# Patient Record
Sex: Female | Born: 1945
Health system: Southern US, Community
[De-identification: ages and names within clinical notes are randomized; demographics above are authoritative.]

## PROBLEM LIST (undated history)

## (undated) DIAGNOSIS — M1990 Unspecified osteoarthritis, unspecified site: Secondary | ICD-10-CM

## (undated) DIAGNOSIS — E785 Hyperlipidemia, unspecified: Secondary | ICD-10-CM

## (undated) DIAGNOSIS — J189 Pneumonia, unspecified organism: Secondary | ICD-10-CM

## (undated) DIAGNOSIS — M8580 Other specified disorders of bone density and structure, unspecified site: Secondary | ICD-10-CM

## (undated) DIAGNOSIS — M810 Age-related osteoporosis without current pathological fracture: Secondary | ICD-10-CM

## (undated) DIAGNOSIS — H3530 Unspecified macular degeneration: Secondary | ICD-10-CM

## (undated) DIAGNOSIS — I8390 Asymptomatic varicose veins of unspecified lower extremity: Secondary | ICD-10-CM

## (undated) HISTORY — DX: Age-related osteoporosis without current pathological fracture: M81.0

## (undated) HISTORY — DX: Other specified disorders of bone density and structure, unspecified site: M85.80

## (undated) HISTORY — DX: Hyperlipidemia, unspecified: E78.5

## (undated) HISTORY — DX: Unspecified macular degeneration: H35.30

## (undated) HISTORY — DX: Asymptomatic varicose veins of unspecified lower extremity: I83.90

## (undated) HISTORY — PX: PARTIAL HYSTERECTOMY: SHX80

## (undated) HISTORY — PX: CATARACT EXTRACTION: SUR2

---

## 2000-09-24 ENCOUNTER — Ambulatory Visit (HOSPITAL_BASED_OUTPATIENT_CLINIC_OR_DEPARTMENT_OTHER): Admission: RE | Admit: 2000-09-24 | Discharge: 2000-09-24 | Payer: Self-pay | Admitting: *Deleted

## 2002-04-13 HISTORY — PX: WRIST FRACTURE SURGERY: SHX121

## 2003-01-05 ENCOUNTER — Ambulatory Visit (HOSPITAL_COMMUNITY): Admission: RE | Admit: 2003-01-05 | Discharge: 2003-01-05 | Payer: Self-pay | Admitting: Gastroenterology

## 2005-04-13 HISTORY — PX: FRACTURE SURGERY: SHX138

## 2005-09-21 ENCOUNTER — Ambulatory Visit (HOSPITAL_BASED_OUTPATIENT_CLINIC_OR_DEPARTMENT_OTHER): Admission: RE | Admit: 2005-09-21 | Discharge: 2005-09-22 | Payer: Self-pay | Admitting: *Deleted

## 2007-09-10 ENCOUNTER — Emergency Department (HOSPITAL_COMMUNITY): Admission: EM | Admit: 2007-09-10 | Discharge: 2007-09-10 | Payer: Self-pay | Admitting: Emergency Medicine

## 2010-08-29 NOTE — Op Note (Signed)
   NAME:  Angela Collier, Angela Collier                        ACCOUNT NO.:  [id number]   MEDICAL RECORD NO.:  [id number]                   PATIENT TYPE:  AMB   LOCATION:  ENDO                                 FACILITY:  [facility]   PHYSICIAN:  John C. Madilyn Fireman, M.D.                 DATE OF BIRTH:  1946/02/19   DATE OF PROCEDURE:  01/05/2003  DATE OF DISCHARGE:                                 OPERATIVE REPORT   PROCEDURE:  Colonoscopy.   INDICATIONS FOR PROCEDURE:  Family history of colon in a first-degree  relative.   DESCRIPTION OF PROCEDURE:  The patient was placed in the left lateral  decubitus position and placed on the pulse monitor with continuous low-flow  oxygen delivered by nasal cannula.  She was sedated with 62.5 mcg IV  fentanyl, 6 mg IV Versed.  The Olympus video colonoscope was inserted into  the rectum and advanced to the cecum, confirmed by transillumination of  McBurney's point and visualization of the ileocecal valve and appendiceal  orifice.  Prep was excellent.  The cecum, ascending, transverse, descending,  and sigmoid colon all appeared normal with no masses, polyps, diverticula,  or other mucosal abnormalities.  The rectum likewise appeared normal and  retroflexed view of the anus revealed no obviously internal hemorrhoids.  The colonoscope was then withdrawn and the patient returned to the recovery  room in stable condition.  She tolerated the procedure well and there were  no immediate complications.   IMPRESSION:  Normal colonoscopy.   PLAN:  Repeat colonoscopy in five years based on her family history.                                                John C. Madilyn Fireman, M.D.    JCH/MEDQ  D:  01/05/2003  T:  01/05/2003  Job:  [id number]   cc:   Sharlet Salina, M.D.  [address]  [place]  [place] [address]  Fax: [phone]

## 2010-08-29 NOTE — Op Note (Signed)
[place]  Patient:    Angela Collier, Angela Collier                     MRN: [id number] Proc. Date: 09/24/00 Adm. Date:  [id number] Attending:  Kendell Bane CC:         Austin Miles. Asencion Islam, M.D.   Operative Report  PREOPERATIVE DIAGNOSIS:  Comminuted intra-articular fracture left distal radius with ulnar styloid fracture.  POSTOPERATIVE DIAGNOSIS:  Comminuted intra-articular fracture left distal radius with ulnar styloid fracture.  OPERATION:  Open reduction and internal fixation left distal radius fracture.  SURGEON:  Lowell Bouton, M.D.  ANESTHESIA:  General.  OPERATIVE FINDINGS:  The patient had a comminuted fracture that had both dorsal and volar comminution.  The intra-articular component of the fracture did not appear to be displaced.  The ulnar styloid fracture did not appear to be displaced.  DESCRIPTION OF PROCEDURE:  Under general anesthesia with the tourniquet on the left arm, the left hand was prepped and draped in the usual fashion.  After exsanguinating the limb, the tourniquet was inflated to 225 mmHg.  The arm was laid on the arm table in a supinated position, and finger traps were placed on the index and middle fingers.  Ten pounds of traction were then applied across the wrist, hanging over the side of the table.  A longitudinal incision was made over the FCR tendons and then extended in a zigzag across the wrist. Sharp dissection was carried through the subcutaneous tissues, and bleeding points were coagulated.  Blunt dissection was carried down to the FCR tendon, and the sheath was incised longitudinally.  The flexor carpi radialis was retracted ulnarly, and the interval between the radial artery and the FCR waws exposed.  Blunt dissection was carried down to the pronator quadratus which was sharply dissected off of the radius radially, leaving a cuff of tissue to reattach it.  A Freer elevator was used to  elevate the pronator quadratus ulnarly.  Weitlaner retractors were inserted for exposure, and the fracture site was identified.  It was irrigated with saline and reduced.  There was a good ulnar fragment that was reduced anatomically.  The more radial fragments were severely comminuted and were osteopenic.  After reducing the fracture, x-rays were obtained with the traction in placed and showed good alignment. The left-sided DVR plate was then applied, and the 3.5 screw was inserted in the sliding slot hole of the plate.  The plate was adjusted for length and rotation.  X-rays were then obtained again showing good position of the plate without involving the joint.  The 2.5 non-threaded pegs were then inserted distally in the plate.  The remaining 3.5 screws x 3 were inserted proximally. This allowed for a buttress effect, and the traction was removed.  X-rays showed good alignment with no motion at the fracture site under C-arm evaluation.  The wound was then copiously irrigated, and the pronator quadratus was reattached with 4-0 Vicryl suture.  A vessel loop drain was left in for drainage.  Subcutaneous tissue was closed with 4-0 Vicryl, and the skin was closed with a 3-0 subcuticular Prolene.  Steri-Strips were applied followed by sterile dressings and a volar wrist splint.  The patient tolerated he procedure well and went to the recovery room awake and stable in good condition. DD:  09/24/00 TD:  09/24/00 Job: [id number]

## 2010-08-29 NOTE — Op Note (Signed)
NAME:  Angela Collier, RAIMER NO.:  [id number]   MEDICAL RECORD NO.:  [id number]          PATIENT TYPE:  AMB   LOCATION:  DSC                          FACILITY:  [facility]   PHYSICIAN:  Tennis Must Meyerdierks, M.D.DATE OF BIRTH:  Feb 07, 1946   DATE OF PROCEDURE:  09/21/2005  DATE OF DISCHARGE:                                 OPERATIVE REPORT   PREOP DIAGNOSIS:  Comminuted intra-articular fracture right distal radius.   POSTOP DIAGNOSIS:  Comminuted intra-articular fracture right distal radius.   PROCEDURE:  Open reduction internal fixation right distal radius fracture  with freeze-dried cadaver bone graft.   SURGEON:  Lowell Bouton, M.D.   ANESTHESIA:  Interscalene block augmented with general.   OPERATIVE FINDINGS:  The patient had a severely comminuted intra-articular  fracture of the distal radius.  There was a large lunate dye-punch fragment  and significant comminution of the radial styloid.  There was also a volar  fragment.   DESCRIPTION OF PROCEDURE:  Under general anesthesia with a tourniquet on the  right arm.  The right hand was prepped and draped in the usual fashion; and  after exsanguinating the limb, the tourniquet was inflated to 250 mmHg.  Longitudinal traction was applied across the end of the table using 10  pounds of traction, and finger traps on the index and long fingers.  A  longitudinal incision was made overlying the FCR tendon volarly.  Sharp  dissection was carried through the subcutaneous tissues and bleeding points  were coagulated.  The FCR tendon sheath was opened with the scissors and  then the tendon was retracted ulnarly and the floor of the sheath was  incised with the scissors.  The median nerve was right there and was  protected.  The palmar cutaneous branch was protected.  The nerve was  retracted and the FPL muscle belly was retracted.   Blunt dissection was then carried down to the fracture and the pronator  quadratus was incised longitudinally and elevated away from the fracture.  The fracture was severely comminuted.  A Freer elevator was used to free up  the fracture fragments and due to the amount of comminution, the intra-  articular fragments were held with 405 K-wire x2.  This allowed a good  reduction of the articular surface.  A standard DVR plate was then applied  volarly; and after inserting the pegs, the K-wires were removed from the  joint fragments.  The 4 proximal screws were inserted along with 7 pegs  distally.  These were a combination of threaded and nonthreaded pegs.   X-rays showed good position of the fracture with good alignment and only  minimal step off at the articular surface.  Under fluoroscopy the fracture  was stable.  A bone graft had been inserted in the defect prior to applying  the plate using freeze-dried cadaver bone.  The bone quality was moderate.  After the fixation was completed, the wound was irrigated with saline and  the pronator quadratus was reattached with a 4-0 Vicryl and a PLS drain was  inserted.  Subcutaneous tissues were closed with 4-0 Vicryl and  the skin was closed  with a 3-0 subcuticular Prolene.  Steri-Strips were applied, followed by  sterile dressings, and a volar wrist splint.  The patient tolerated the  procedure well, and went to the recovery room awake and stable, in good  condition.      Lowell Bouton, M.D.  Electronically Signed     EMM/MEDQ  D:  09/21/2005  T:  09/21/2005  Job:  [id number]

## 2010-09-15 ENCOUNTER — Other Ambulatory Visit: Payer: Self-pay | Admitting: Family Medicine

## 2010-09-15 ENCOUNTER — Ambulatory Visit
Admission: RE | Admit: 2010-09-15 | Discharge: 2010-09-15 | Disposition: A | Discharge status: Home / Self Care | Payer: BC Managed Care – PPO | Source: Ambulatory Visit | Attending: Family Medicine | Admitting: Family Medicine

## 2010-09-15 DIAGNOSIS — M21949 Unspecified acquired deformity of hand, unspecified hand: Secondary | ICD-10-CM

## 2012-10-19 ENCOUNTER — Other Ambulatory Visit: Payer: Self-pay | Admitting: *Deleted

## 2012-10-19 DIAGNOSIS — I83893 Varicose veins of bilateral lower extremities with other complications: Secondary | ICD-10-CM

## 2012-10-27 ENCOUNTER — Encounter: Payer: Self-pay | Admitting: Vascular Surgery

## 2012-12-20 ENCOUNTER — Encounter: Payer: Self-pay | Admitting: Vascular Surgery

## 2012-12-21 ENCOUNTER — Encounter (INDEPENDENT_AMBULATORY_CARE_PROVIDER_SITE_OTHER): Payer: Medicare Other | Admitting: *Deleted

## 2012-12-21 ENCOUNTER — Ambulatory Visit (INDEPENDENT_AMBULATORY_CARE_PROVIDER_SITE_OTHER): Payer: Medicare Other | Admitting: Vascular Surgery

## 2012-12-21 ENCOUNTER — Encounter: Payer: Self-pay | Admitting: Vascular Surgery

## 2012-12-21 VITALS — Ht 63.0 in | Wt 131.0 lb

## 2012-12-21 DIAGNOSIS — I83893 Varicose veins of bilateral lower extremities with other complications: Secondary | ICD-10-CM

## 2012-12-21 NOTE — Progress Notes (Signed)
[facility]  Patient name: Angela Collier MRN: [id number] DOB: 12/04/1945 Sex: female  REASON FOR CONSULT: varicose veins.  HPI: Angela Collier is a 67 y.o. female with a long history of varicose veins which began when she began having children many years ago. He experiences aching pain and heaviness in both lower extremities. Her symptoms are more significant on the left side. She also notes occasional swelling in her lower extremities. She denies any previous history of DVT or phlebitis. 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 She denies any previous history of DVT or phlebitis.  I have reviewed her records from Dr. Alver Fisher office. She does have a history of osteopenia and takes calcium. She has hyperlipidemia which is followed closely.  Past Medical History  Diagnosis Date  . Macular degeneration   . Hyperlipidemia   . Osteopenia   . Varicose veins    Family History  Problem Relation Age of Onset  . Osteoarthritis Mother   . Cancer Father     liver  . Heart disease Father   . Hyperlipidemia Father   . Hypertension Father   . Heart attack Father   . Other Father     varicose veins  . Hyperlipidemia Sister    SOCIAL HISTORY: History  Substance Use Topics  . Smoking status: Never Smoker   . Smokeless tobacco: Never Used  . Alcohol Use: 1.8 oz/week    3 Glasses of wine per week   Allergies  Allergen Reactions  . Sulfa Antibiotics    Current Outpatient Prescriptions  Medication Sig Dispense Refill  . Multiple Vitamins-Minerals (MULTIVITAMIN WITH MINERALS) tablet Take 1 tablet by mouth daily.      Marland Kitchen Specialty Vitamins Products (ICAPS LUTEIN-ZEAXANTHIN PO) Take by mouth.       No current facility-administered medications for this visit.   REVIEW OF SYSTEMS: Arly.Keller ] denotes positive finding; [  ] denotes negative finding  CARDIOVASCULAR:  [ ]  chest pain   [ ]  chest pressure   [ ]  palpitations   [ ]  orthopnea   [ ]   dyspnea on exertion   [ ]  claudication   [ ]  rest pain   [ ]  DVT   [ ]  phlebitis PULMONARY:   [ ]  productive cough   [ ]  asthma   [ ]  wheezing NEUROLOGIC:   [ ]  weakness  [ ]  paresthesias  [ ]  aphasia  [ ]  amaurosis  [ ]  dizziness HEMATOLOGIC:   [ ]  bleeding problems   [ ]  clotting disorders MUSCULOSKELETAL:  [ ]  joint pain   [ ]  joint swelling [ ]  leg swelling GASTROINTESTINAL: [ ]   blood in stool  [ ]   hematemesis GENITOURINARY:  [ ]   dysuria  [ ]   hematuria PSYCHIATRIC:  [ ]  history of major depression INTEGUMENTARY:  [ ]  rashes  [ ]  ulcers CONSTITUTIONAL:  [ ]  fever   [ ]  chills  PHYSICAL EXAM: Filed Vitals:   12/21/12 1059  Height: 5\' 3"  (1.6 m)  Weight: 131 lb (59.421 kg)   Body mass index is 23.21 kg/(m^2). GENERAL: The patient is a well-nourished female, in no acute distress. The vital signs are documented above. CARDIOVASCULAR: There is a regular rate and rhythm. I do not detect carotid bruits. She has palpable femoral pulses and palpable pedal pulses bilaterally. PULMONARY: There is good air exchange bilaterally without wheezing or rales. ABDOMEN: Soft and non-tender with normal pitched bowel sounds.  MUSCULOSKELETAL: There are no  major deformities or cyanosis. NEUROLOGIC: No focal weakness or paresthesias are detected. SKIN: There are no ulcers or rashes noted. She has significant truncal varicosities of both lower extremities. PSYCHIATRIC: The patient has a normal affect.  DATA:  I have independently interpreted her venous duplex scan. On the right side, she does have incompetence of the common femoral vein. In addition she has incompetence of the saphenofemoral junction and proximal right greater saphenous vein. She has some incompetence of the common femoral vein on the left also and also of the popliteal vein. There is no incompetence of the left greater saphenous vein. She is noted to have a Baker's cyst in the left popliteal fossa.  MEDICAL ISSUES: PAINFUL VARICOSE  VEINS OF BOTH LOWER EXTREMITIES: We have discussed the importance of intermittent leg elevation and the proper positioning for this. In addition I have written her a prescription for a thigh high compression stockings with a gradient of 20-30 mm of mercury. I have encouraged her to stay as active as possible but to try to avoid prolonged sitting and standing. If her symptoms progress and she could be considered for laser ablation of her right greater saphenous vein. She will call if her symptoms progress.  Herson Prichard S [facility] [place] [facility]: [phone]

## 2013-02-16 ENCOUNTER — Other Ambulatory Visit: Payer: Self-pay

## 2013-03-21 ENCOUNTER — Ambulatory Visit: Payer: Medicare Other | Admitting: Vascular Surgery

## 2013-03-28 ENCOUNTER — Ambulatory Visit: Payer: Medicare Other | Admitting: Vascular Surgery

## 2016-01-13 ENCOUNTER — Ambulatory Visit (INDEPENDENT_AMBULATORY_CARE_PROVIDER_SITE_OTHER): Payer: Medicare Other | Admitting: Rheumatology

## 2016-01-13 DIAGNOSIS — M1711 Unilateral primary osteoarthritis, right knee: Secondary | ICD-10-CM

## 2016-01-13 DIAGNOSIS — M19071 Primary osteoarthritis, right ankle and foot: Secondary | ICD-10-CM

## 2016-01-13 DIAGNOSIS — M1712 Unilateral primary osteoarthritis, left knee: Secondary | ICD-10-CM | POA: Diagnosis not present

## 2016-01-14 ENCOUNTER — Other Ambulatory Visit: Payer: Self-pay | Admitting: Rheumatology

## 2016-01-14 DIAGNOSIS — M25461 Effusion, right knee: Secondary | ICD-10-CM

## 2016-01-26 ENCOUNTER — Ambulatory Visit (HOSPITAL_COMMUNITY)
Admission: RE | Admit: 2016-01-26 | Discharge: 2016-01-26 | Disposition: A | Discharge status: Home / Self Care | Payer: Medicare Other | Source: Ambulatory Visit | Attending: Rheumatology | Admitting: Rheumatology

## 2016-01-26 DIAGNOSIS — M25461 Effusion, right knee: Secondary | ICD-10-CM | POA: Insufficient documentation

## 2016-01-26 DIAGNOSIS — M2241 Chondromalacia patellae, right knee: Secondary | ICD-10-CM | POA: Insufficient documentation

## 2016-01-26 DIAGNOSIS — M7121 Synovial cyst of popliteal space [Baker], right knee: Secondary | ICD-10-CM | POA: Diagnosis not present

## 2016-01-27 ENCOUNTER — Ambulatory Visit (HOSPITAL_COMMUNITY): Admission: RE | Admit: 2016-01-27 | Payer: Medicare Other | Source: Ambulatory Visit

## 2016-02-05 ENCOUNTER — Ambulatory Visit: Payer: Medicare Other | Admitting: Rheumatology

## 2016-02-24 ENCOUNTER — Encounter (INDEPENDENT_AMBULATORY_CARE_PROVIDER_SITE_OTHER): Payer: Self-pay | Admitting: Orthopaedic Surgery

## 2016-02-24 ENCOUNTER — Ambulatory Visit (INDEPENDENT_AMBULATORY_CARE_PROVIDER_SITE_OTHER): Payer: Medicare Other | Admitting: Orthopaedic Surgery

## 2016-02-24 ENCOUNTER — Telehealth: Payer: Self-pay | Admitting: Radiology

## 2016-02-24 VITALS — BP 130/82 | HR 64 | Ht 63.0 in | Wt 135.0 lb

## 2016-02-24 DIAGNOSIS — M170 Bilateral primary osteoarthritis of knee: Secondary | ICD-10-CM | POA: Insufficient documentation

## 2016-02-24 DIAGNOSIS — M19042 Primary osteoarthritis, left hand: Secondary | ICD-10-CM | POA: Insufficient documentation

## 2016-02-24 DIAGNOSIS — M25562 Pain in left knee: Secondary | ICD-10-CM | POA: Diagnosis not present

## 2016-02-24 DIAGNOSIS — G8929 Other chronic pain: Secondary | ICD-10-CM

## 2016-02-24 DIAGNOSIS — M25461 Effusion, right knee: Secondary | ICD-10-CM | POA: Insufficient documentation

## 2016-02-24 DIAGNOSIS — M19041 Primary osteoarthritis, right hand: Secondary | ICD-10-CM | POA: Insufficient documentation

## 2016-02-24 DIAGNOSIS — H3530 Unspecified macular degeneration: Secondary | ICD-10-CM | POA: Insufficient documentation

## 2016-02-24 DIAGNOSIS — M19072 Primary osteoarthritis, left ankle and foot: Secondary | ICD-10-CM

## 2016-02-24 DIAGNOSIS — M25561 Pain in right knee: Secondary | ICD-10-CM

## 2016-02-24 DIAGNOSIS — S83006A Unspecified dislocation of unspecified patella, initial encounter: Secondary | ICD-10-CM | POA: Insufficient documentation

## 2016-02-24 DIAGNOSIS — M19071 Primary osteoarthritis, right ankle and foot: Secondary | ICD-10-CM | POA: Insufficient documentation

## 2016-02-24 NOTE — Progress Notes (Signed)
Dr. Estanislado Pandy ordered an MRI of the Right knee for PW to look at. Overexercertion makes the Right knee swell and has had fluid removed several times in BIL knees. Pt was dancing and hurt her knee, does plenty of exercise, Dr. Estanislado Pandy aspirated knee  Ice, elevation, rest and advil. This time nothing has worked and she still has swelling.  Patient visits the office for evaluation of bilateral knee pain. He began to experience significant right knee pain after dancing for several days at the national folk festival in [place] in early September. Alfonse Spruce a lot of swelling popping and clicking. After Deveshwar aspirated her knee and injected cortisone. She's not sure it made much of a difference. Cortically, an MRI scan was performed on 01/26/2016. This demonstrated degeneration of the posterior horn and mid body of the medial meniscus with a small pair of meniscal cyst. She also had complete denuding of the articular cartilage of the patella with extensive chondromalacia of the trochlear groove. She had slight thinning of the articular cartilage of the posterior portion of the medial compartment and focal grade 4 chondromalacia of the lateral compartment. As taken Advil and it seems to made a difference in terms of the swelling. She still having some grinding and grating.  He also has been experiencing some "left knee pain" with similar symptoms. He had many questions regarding her diagnosis and treatment.  On examination of the right knee there was considerable crepitation with patella motion although she had full extension and a least 115 of flexion. There was no instability. Thought the patella was in a lateral position was calf discomfort neurologically she was intact. Is minimal joint pain and no effusion.  Dissemination of the left knee there was a small effusion and similar findings with patella crepitation and lateral position of the patella. This full extension and over 115 of flexion without  instability neurologically intact.  On discussion with the patient and her husband regarding both of her knees she essentially has end-stage osteoarthritis. I think treatment at this point is conservative. 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 I would suggest that she continue with the anti-inflammatory medicines her exercises and occasional injection of cortisone. We have also discussed Visco supplementation. In terms of surgery I think the only definitive procedure would be a total knee replacement and she is now ready. For about a half an hour regarding the above and I think she feels that she is informed and we'll plan to see her back on a when necessary basis.

## 2016-02-24 NOTE — Progress Notes (Deleted)
Office Visit Note  Patient: Angela Collier             Date of Birth: 1945/04/26           MRN: [id number]             PCP: Mayra Neer, MD Referring: Mayra Neer, MD Visit Date: 02/26/2016 Occupation: Exercise instructor    Subjective:  No chief complaint on file.   History of Present Illness: Angela Collier is a 70 y.o. female ***   Activities of Daily Living:  Patient reports morning stiffness for *** {minute/hour:19697}.   Patient {ACTIONS;DENIES/REPORTS:21021675::"Denies"} nocturnal pain.  Difficulty dressing/grooming: {ACTIONS;DENIES/REPORTS:21021675::"Denies"} Difficulty climbing stairs: {ACTIONS;DENIES/REPORTS:21021675::"Denies"} Difficulty getting out of chair: {ACTIONS;DENIES/REPORTS:21021675::"Denies"} Difficulty using hands for taps, buttons, cutlery, and/or writing: {ACTIONS;DENIES/REPORTS:21021675::"Denies"}   No Rheumatology ROS completed.   PMFS History:  Patient Active Problem List   Diagnosis Date Noted  . Varicose veins of lower extremities with other complications 79/39/0300    Past Medical History:  Diagnosis Date  . Hyperlipidemia   . Macular degeneration   . Osteopenia   . Varicose veins     Family History  Problem Relation Age of Onset  . Osteoarthritis Mother   . Cancer Father     liver  . Heart disease Father   . Hyperlipidemia Father   . Hypertension Father   . Heart attack Father   . Other Father     varicose veins  . Hyperlipidemia Sister    Past Surgical History:  Procedure Laterality Date  . CATARACT EXTRACTION Right    2011  . PARTIAL HYSTERECTOMY     Social History   Social History Narrative  . No narrative on file     Objective: Vital Signs: There were no vitals taken for this visit.   Physical Exam   Musculoskeletal Exam: ***  CDAI Exam: No CDAI exam completed.    Investigation: Findings:  May 2015 G6PD normal, hepatitis negative, ESR normal, uric acid 4.1, Ace normal, rheumatoid factor  negative, CCP negative, HLA-B27 negative 01/13/2016 CMP normal, CBC normal, ESR 4, SPEP normal, CCP negative, 14 33 eta negative, TB negative    Imaging: Mr Knee Right Wo Contrast  Result Date: 01/27/2016 CLINICAL DATA:  Persistent right knee effusion. EXAM: MRI OF THE RIGHT KNEE WITHOUT CONTRAST TECHNIQUE: Multiplanar, multisequence MR imaging of the knee was performed. No intravenous contrast was administered. COMPARISON:  None. FINDINGS: MENISCI Medial meniscus: There is degeneration of the posterior horn and midbody with fraying of the free edge with a subtle small focal undersurface horizontal tear which extends to the periphery with a tiny parameniscal cyst best seen on image 17 of series 8. Lateral meniscus:  Intact. LIGAMENTS Cruciates:  Normal. Collaterals:  Normal. CARTILAGE Patellofemoral: Complete denuding of the articular cartilage of the patella extensive chondromalacia of the trochlear groove of the distal femur. The upper pole of the patella has chronically eroded the anterior cortex of the distal shaft of the femur. Medial: Slight thinning of the articular cartilage of the posterior central aspect of the femoral condyle. Lateral: Focal grade 4 chondromalacia of the mid periphery of the femoral condyle. Joint: Minimal joint effusion. 8 mm loose body just medial to the posterior cruciate ligament. Popliteal Fossa:  Small complex Baker's cyst. Extensor Mechanism:  Intact.  Lateral subluxation of the patella. Bones:  Small tricompartmental marginal osteophytes. IMPRESSION: 1. Severe chondromalacia and osteoarthritis of the patellofemoral compartment. 2. Degeneration of the midbody and posterior horn of the medial meniscus with a small focal horizontal undersurface  tear of the posterior horn. 3. Grade 4 chondromalacia of the periphery of the mid lateral femoral condyle. 4. Minimal joint effusion.  Complex small Baker's cyst. Electronically Signed   By: Lorriane Shire M.D.   On: 01/27/2016 09:25     Speciality Comments: No specialty comments available.    Procedures:  No procedures performed Allergies: Sulfa antibiotics   Assessment / Plan: Visit Diagnoses: Osteoarthritis of both knees - Right mild, left moderate  Knee effusion, right - Recurrent knee effusion, all autoimmune workup negative  Patellar dislocation, - Bilateral  Osteoarthritis of both feet  Osteoarthritis of both hands  Macular degeneration    Orders: No orders of the defined types were placed in this encounter.  No orders of the defined types were placed in this encounter.   Face-to-face time spent with patient was *** minutes. 50% of time was spent in counseling and coordination of care.  Follow-Up Instructions: No Follow-up on file.   Bo Merino, MD

## 2016-02-24 NOTE — Telephone Encounter (Signed)
Patient was here on 01/13/16 you ordered   sed rate, CCP, 14-3-3 eta, CBC, comprehensive metabolic panel, SPEP and TB Gold since her knee was painful, you also ordered MRI scan of her knee.  The scan showed severed chondromalacia patella, so she was referred to Dr Tanda Rockers.  She has asked Seth Bake if she needs to keep appt with you this week on Wed, I have had Seth Bake advise her to just have patient follow with Dr Durward Fortes for the knee and RS this weeks appt to 6 months from now.   To you FYI  sed rate, CCP, 14-3-3 eta, CBC, comprehensive metabolic panel, SPEP and TB Gold were all normal

## 2016-02-26 ENCOUNTER — Ambulatory Visit: Payer: Medicare Other | Admitting: Rheumatology

## 2016-08-04 NOTE — Progress Notes (Signed)
Office Visit Note  Patient: Angela Collier             Date of Birth: Apr 12, 1946           MRN: [id number]             PCP: Mayra Neer, MD Referring: Mayra Neer, MD Visit Date: 08/12/2016 Occupation: @GUAROCC @    Subjective:  Pain in knees   History of Present Illness: Angela Collier is a 71 y.o. female with history of osteoarthritis and chondromalacia patella. She states she's been having some discomfort in her bilateral knee joints. Her right knee joint is quite swollen today she states her knee joints swell only with certain activities otherwise she does fairly well. She does have arthritis in her hands and feet but they're not very bothersome. She saw Dr. Durward Fortes who recommended total knee replacement only if she has increased pain.  Activities of Daily Living:  Patient reports morning stiffness for 0 minute.   Patient Denies nocturnal pain.  Difficulty dressing/grooming: Denies Difficulty climbing stairs: Reports Difficulty getting out of chair: Reports Difficulty using hands for taps, buttons, cutlery, and/or writing: Denies   Review of Systems  Constitutional: Negative for fatigue, night sweats, weight gain, weight loss and weakness.  HENT: Negative for mouth sores, trouble swallowing, trouble swallowing, mouth dryness and nose dryness.   Eyes: Negative for pain, redness, visual disturbance and dryness.  Respiratory: Negative for cough, shortness of breath and difficulty breathing.   Cardiovascular: Negative for chest pain, palpitations, hypertension, irregular heartbeat and swelling in legs/feet.  Gastrointestinal: Negative for blood in stool, constipation and diarrhea.  Endocrine: Negative for increased urination.  Genitourinary: Negative for vaginal dryness.  Musculoskeletal: Positive for arthralgias, joint pain and joint swelling. Negative for myalgias, muscle weakness, morning stiffness, muscle tenderness and myalgias.  Skin: Negative for color change,  rash, hair loss, skin tightness, ulcers and sensitivity to sunlight.  Allergic/Immunologic: Negative for susceptible to infections.  Neurological: Negative for dizziness, memory loss and night sweats.  Hematological: Negative for swollen glands.  Psychiatric/Behavioral: Negative for depressed mood and sleep disturbance. The patient is not nervous/anxious.     PMFS History:  Patient Active Problem List   Diagnosis Date Noted  . Age-related osteoporosis without current pathological fracture 08/12/2016  . Osteoarthritis of both knees 02/24/2016  . Knee effusion, right 02/24/2016  . Patellar luxation 02/24/2016  . Osteoarthritis of both feet 02/24/2016  . Osteoarthritis of both hands 02/24/2016  . Macular degeneration 02/24/2016  . Varicose veins of lower extremities with other complications 40/34/7425    Past Medical History:  Diagnosis Date  . Hyperlipidemia   . Macular degeneration   . Osteopenia   . Osteoporosis   . Varicose veins     Family History  Problem Relation Age of Onset  . Osteoarthritis Mother   . Cancer Father     liver  . Heart disease Father   . Hyperlipidemia Father   . Hypertension Father   . Heart attack Father   . Other Father     varicose veins  . Hyperlipidemia Sister    Past Surgical History:  Procedure Laterality Date  . CATARACT EXTRACTION Right    2011  . PARTIAL HYSTERECTOMY    . WRIST FRACTURE SURGERY Bilateral 2004   Social History   Social History Narrative  . No narrative on file     Objective: Vital Signs: BP (!) 148/84 (BP Location: Left Arm, Patient Position: Sitting, Cuff Size: Normal)   Pulse 72  Resp 14   Ht 5' 2"  (1.575 m)   Wt 142 lb (64.4 kg)   BMI 25.97 kg/m    Physical Exam  Constitutional: She is oriented to person, place, and time. She appears well-developed and well-nourished.  HENT:  Head: Normocephalic and atraumatic.  Eyes: Conjunctivae and EOM are normal.  Neck: Normal range of motion.    Cardiovascular: Normal rate, regular rhythm, normal heart sounds and intact distal pulses.   Pulmonary/Chest: Effort normal and breath sounds normal.  Abdominal: Soft. Bowel sounds are normal.  Lymphadenopathy:    She has no cervical adenopathy.  Neurological: She is alert and oriented to person, place, and time.  Skin: Skin is warm and dry. Capillary refill takes less than 2 seconds.  Psychiatric: She has a normal mood and affect. Her behavior is normal.  Nursing note and vitals reviewed.    Musculoskeletal Exam: C-spine and thoracic lumbar spine good range of motion. Shoulder joints elbow joints are good range of motion. She is some synovial thickening over bilateral second MCP joint and synovitis and right second and third MCP joint. She also had some synovitis in her PIP joints. Hip joints are good range of motion. She had effusion in bilateral knee joints large effusion in the right knee joint. She has subluxation of all of her MTP joints. But no synovitis was noted.  CDAI Exam: No CDAI exam completed.    Investigation: Findings:  10/17 CBC nl, CMP n, ESR 4, SPEP n, CCP neg, 14-3-3ETA neg, TB neg.    Imaging: No results found.  Speciality Comments: No specialty comments available.    Procedures:  Large Joint Inj Date/Time: 08/12/2016 11:37 AM Performed by: Bo Merino Authorized by: Bo Merino   Consent Given by:  Patient Site marked: the procedure site was marked   Timeout: prior to procedure the correct patient, procedure, and site was verified   Indications:  Pain and joint swelling Location:  Knee Site:  R knee Prep: patient was prepped and draped in usual sterile fashion   Needle Size:  22 G Needle Length:  1.5 inches Approach:  Medial Ultrasound Guidance: No   Fluoroscopic Guidance: No   Arthrogram: No   Medications:  3 mL lidocaine 1 %; 60 mg triamcinolone acetonide 40 MG/ML Aspiration Attempted: Yes   Aspirate amount (mL):  10 Aspirate:   Blood-tinged Patient tolerance:  Patient tolerated the procedure well with no immediate complications   Allergies: Sulfa antibiotics   Assessment / Plan:     Visit Diagnoses: Inflammatory arthritis seronegative: She has had recurrent effusion of her knee joints. Over the several previous visits we have discussed possible DMARD as a trial to see if it will control her arthritis. She is very hesitant to go on any pharmacological agent. She wants to try only natural therapy at this point. She is going on a trip and requested her right knee joint aspiration again. I believe she may have underlying rheumatoid arthritis with synovitis involving her MCP joints knee joints and subluxation of her MTP joints. I did not see any erosive changes on the x-rays which were performed initially. I've advised her to get a second opinion from [place]. She is in agreement and we'll request that referral.  Osteoarthritis of both hands - with DIP PIP changes and also synovitis over her MCP joints.  Effusion of right knee: The procedure is described above.  Osteoarthritis of both knees - severe chondromalacia patellae, right knee joint recurrent effusion (all autoimmune workup negative)  Osteoarthritis of both feet - calcaneal spurs. She also has subluxation of all of her MTP joints which could be consistent with inflammatory arthritis although she did not have any synovitis.  Patellar dislocation, bilateral, subsequent encounter  Age-related osteoporosis without current pathological fracture : Patient reports recent diagnosis of osteoporosis. She does not want to take any treatment for osteoporosis and wants to try natural therapy.   Orders: Orders Placed This Encounter  Procedures  . Large Joint Injection/Arthrocentesis   No orders of the defined types were placed in this encounter.   Face-to-face time spent with patient was 45 minutes. 50% of time was spent in counseling and coordination of care.  Follow-Up  Instructions: Return in about 6 months (around 02/12/2017) for Inflammatory arthritis.   Bo Merino, MD  Note - This record has been created using Editor, commissioning.  Chart creation errors have been sought, but may not always  have been located. Such creation errors do not reflect on  the standard of medical care.

## 2016-08-12 ENCOUNTER — Ambulatory Visit (INDEPENDENT_AMBULATORY_CARE_PROVIDER_SITE_OTHER): Payer: Medicare Other | Admitting: Rheumatology

## 2016-08-12 ENCOUNTER — Encounter: Payer: Self-pay | Admitting: Rheumatology

## 2016-08-12 VITALS — BP 148/84 | HR 72 | Resp 14 | Ht 62.0 in | Wt 142.0 lb

## 2016-08-12 DIAGNOSIS — M170 Bilateral primary osteoarthritis of knee: Secondary | ICD-10-CM

## 2016-08-12 DIAGNOSIS — M19041 Primary osteoarthritis, right hand: Secondary | ICD-10-CM | POA: Diagnosis not present

## 2016-08-12 DIAGNOSIS — M19071 Primary osteoarthritis, right ankle and foot: Secondary | ICD-10-CM | POA: Diagnosis not present

## 2016-08-12 DIAGNOSIS — M1990 Unspecified osteoarthritis, unspecified site: Secondary | ICD-10-CM | POA: Diagnosis not present

## 2016-08-12 DIAGNOSIS — S83006D Unspecified dislocation of unspecified patella, subsequent encounter: Secondary | ICD-10-CM

## 2016-08-12 DIAGNOSIS — M19042 Primary osteoarthritis, left hand: Secondary | ICD-10-CM | POA: Diagnosis not present

## 2016-08-12 DIAGNOSIS — M25461 Effusion, right knee: Secondary | ICD-10-CM

## 2016-08-12 DIAGNOSIS — M810 Age-related osteoporosis without current pathological fracture: Secondary | ICD-10-CM | POA: Diagnosis not present

## 2016-08-12 DIAGNOSIS — M19072 Primary osteoarthritis, left ankle and foot: Secondary | ICD-10-CM

## 2016-08-12 MED ORDER — TRIAMCINOLONE ACETONIDE 40 MG/ML IJ SUSP
60.0000 mg | INTRAMUSCULAR | Status: AC | PRN
  Administered 2016-08-12: 60 mg via INTRA_ARTICULAR

## 2016-08-12 MED ORDER — LIDOCAINE HCL 1 % IJ SOLN
3.0000 mL | INTRAMUSCULAR | Status: AC | PRN
  Administered 2016-08-12: 3 mL

## 2016-08-27 ENCOUNTER — Telehealth: Payer: Self-pay | Admitting: *Deleted

## 2016-08-27 DIAGNOSIS — M1990 Unspecified osteoarthritis, unspecified site: Secondary | ICD-10-CM

## 2016-08-27 NOTE — Telephone Encounter (Signed)
Received Bone density scan results. Reviewed by Dr. Estanislado Pandy. Consistent with Osteoporosis. Patient needs appointment to discuss treatment options.   T-Score -2.5

## 2016-08-27 NOTE — Telephone Encounter (Signed)
Patient advised Dr. Estanislado Pandy would like her to schedule an appointment to discuss bone density scan results. Patient transferred to front to schedule appointment. Patient asked about referral to [facility]. According to last office note patient to be referred to [facility] for second opinion. Referral placed.

## 2016-08-27 NOTE — Telephone Encounter (Signed)
Error

## 2016-08-27 NOTE — Addendum Note (Signed)
Addended by: Carole Binning on: 08/27/2016 04:51 PM   Modules accepted: Orders

## 2016-09-01 ENCOUNTER — Telehealth: Payer: Self-pay | Admitting: Rheumatology

## 2016-09-01 NOTE — Telephone Encounter (Signed)
I called [facility] and Patient, Patient notified she needs to call [facility] with new insurance ID for [employer] option, [phone] opt 1, per [facility] - referral does not need to be refaxed.

## 2016-09-01 NOTE — Telephone Encounter (Signed)
Patient calling concerning referral to [facility]. Patient has worked it out with insurance. She has [employer] option that is covered with [facility]. So now patient needs our office to call back to [facility] to confirm consult request, and schedule appt. Patient spoke with [employer] people at [place], and got that part situated. Please call patient with referral appt.

## 2016-09-21 ENCOUNTER — Telehealth (INDEPENDENT_AMBULATORY_CARE_PROVIDER_SITE_OTHER): Payer: Self-pay

## 2016-09-21 NOTE — Telephone Encounter (Signed)
Please advise 

## 2016-09-21 NOTE — Telephone Encounter (Signed)
Patient would like a phone call to discuss her bone density results instead of coming into the office.  CB# is [phone].  Please Advise.

## 2016-09-22 NOTE — Telephone Encounter (Signed)
Pt was informed that her dexa from April 2018 shows oporosis.  Pt cannot come to see [place] b/c she is now classified as out of network by [facility] until she sees her Susa Day (scheduled for august 2018 ) for 2nd opinion for her ?? Arthritis (no makers on lab test --> so dr. D referred her to [facility] for 2nd opion).

## 2016-09-22 NOTE — Telephone Encounter (Signed)
Discuss DXA results

## 2016-10-07 ENCOUNTER — Ambulatory Visit: Payer: Medicare Other | Admitting: Rheumatology

## 2016-11-04 ENCOUNTER — Telehealth: Payer: Self-pay | Admitting: Rheumatology

## 2016-11-04 NOTE — Telephone Encounter (Signed)
Patient request a call back to verify that all her records have been sent to [facility] for her to be seen there. Advised patient we would have sent all records if we referred her there, but patient wants confirmation that records were sent. Please call, and advise.

## 2016-11-05 NOTE — Telephone Encounter (Signed)
Left message for patient that records from Dr. Estanislado Pandy have been sent for referral.

## 2016-11-26 ENCOUNTER — Ambulatory Visit (INDEPENDENT_AMBULATORY_CARE_PROVIDER_SITE_OTHER): Payer: Medicare Other | Admitting: Orthopaedic Surgery

## 2016-11-26 ENCOUNTER — Encounter (INDEPENDENT_AMBULATORY_CARE_PROVIDER_SITE_OTHER): Payer: Self-pay | Admitting: Orthopaedic Surgery

## 2016-11-26 ENCOUNTER — Ambulatory Visit (INDEPENDENT_AMBULATORY_CARE_PROVIDER_SITE_OTHER): Payer: Medicare Other

## 2016-11-26 DIAGNOSIS — M25562 Pain in left knee: Secondary | ICD-10-CM

## 2016-11-26 DIAGNOSIS — G8929 Other chronic pain: Secondary | ICD-10-CM

## 2016-11-26 DIAGNOSIS — M25561 Pain in right knee: Secondary | ICD-10-CM

## 2016-11-26 NOTE — Progress Notes (Signed)
Office Visit Note   Patient: Angela Collier           Date of Birth: 15-Apr-1945           MRN: [id number] Visit Date: 11/26/2016              Requested by: Mayra Neer, MD [address] [place], [place] [address] PCP: Mayra Neer, MD   Assessment & Plan: Visit Diagnoses:  1. Left knee pain, unspecified chronicity   2. Chronic pain of right knee   End-stage osteoarthritis both knees  Plan: Long discussion regarding diagnosis and treatment options including cortisone, Visco supplementation and even need to replacement as definitive procedure. I discussed that in more detail regarding incision hospitalization and rehabilitation. Patient is planning a trip to [place] Middle of September should return for cortisone injections prior to her trip  Follow-Up Instructions: Return if symptoms worsen or fail to improve.   Orders:  Orders Placed This Encounter  Procedures  . XR KNEE 3 VIEW LEFT  . XR KNEE 3 VIEW RIGHT   No orders of the defined types were placed in this encounter.     Procedures: No procedures performed   Clinical Data: No additional findings.   Subjective: Chief Complaint  Patient presents with  . Left Knee - Pain    Pt presents with Left knee pain x 4 days. She relates she overworked it for several days . Used ice,ibuprofen, swelling decreased .  Angela Collier is accompanied by her husband here for evaluation of relatively acute onset of left knee pain. She's been evaluated in the past by Dr. Estanislado Pandy and me with evidence of significant osteoarthritis of both knees. On this occasion she had a hyperflexion injury to her right knee with popping clicking and pain with subsequent onset of effusion. She is planning a trip to [place] in September and is concerned about her knees. She feels like her left knee is now swollen. She's not having any numbness or tingling. No skin changes. No groin pain back pain or thigh discomfort. She denies shortness  of breath or chest pain fever or chills  HPI  Review of Systems  Constitutional: Negative for chills, fatigue and fever.  Eyes: Negative for itching.  Respiratory: Negative for chest tightness and shortness of breath.   Cardiovascular: Positive for leg swelling. Negative for chest pain and palpitations.  Gastrointestinal: Negative for blood in stool, constipation and diarrhea.  Musculoskeletal: Negative for back pain, joint swelling, neck pain and neck stiffness.  Neurological: Positive for weakness. Negative for dizziness, numbness and headaches.  Hematological: Does not bruise/bleed easily.  Psychiatric/Behavioral: Negative for sleep disturbance. The patient is not nervous/anxious.   All other systems reviewed and are negative.    Objective: Vital Signs: There were no vitals taken for this visit.  Physical Exam  Ortho Exam awake alert and oriented 3. Comfortable in the sitting position. Considerable patellar crepitation bilaterally there is lateral position of the patella and both knees. Small effusion left knee and not right. Mild medial lateral joint pain. Full extension and flexion over 115 without instability. No calf pain. Skin intact. Neurovascular exam intact. No pain with range of motion of either hip.  Specialty Comments:  No specialty comments available.  Imaging: Xr Knee 3 View Left  Result Date: 11/26/2016 Films of the left knee were obtained in 3 projections standing. There is a decrease in the medial joint space consistent with osteoarthritis without ectopic calcification. The joint space medially in the  left knee is more narrowed than the right. She fights noted both medially and laterally. Diffuse bony demineralization. Complete loss of joint space of the lateral patellofemoral joint and lateral patella subluxation. All of the above are consistent with end-stage osteoarthritis  Xr Knee 3 View Right  Result Date: 11/26/2016 Films of the right knee were obtained  in 3 projections standing. There is diffuse bony demineralization. The joint spaces are still relatively well maintained but there is irregularity along the femoral-tibial joint surfaces both medially and laterally was areas of subchondral sclerosis and osteophyte formation severe lateral subluxation of patella with loss of joint space no ectopic calcification. All consistent with end-stage osteoarthritis    PMFS History: Patient Active Problem List   Diagnosis Date Noted  . Age-related osteoporosis without current pathological fracture 08/12/2016  . Osteoarthritis of both knees 02/24/2016  . Knee effusion, right 02/24/2016  . Patellar luxation 02/24/2016  . Osteoarthritis of both feet 02/24/2016  . Osteoarthritis of both hands 02/24/2016  . Macular degeneration 02/24/2016  . Varicose veins of lower extremities with other complications 17/91/5056   Past Medical History:  Diagnosis Date  . Hyperlipidemia   . Macular degeneration   . Osteopenia   . Osteoporosis   . Varicose veins     Family History  Problem Relation Age of Onset  . Osteoarthritis Mother   . Cancer Father        liver  . Heart disease Father   . Hyperlipidemia Father   . Hypertension Father   . Heart attack Father   . Other Father        varicose veins  . Hyperlipidemia Sister     Past Surgical History:  Procedure Laterality Date  . CATARACT EXTRACTION Right    2011  . PARTIAL HYSTERECTOMY    . WRIST FRACTURE SURGERY Bilateral 2004   Social History   Occupational History  . Not on file.   Social History Main Topics  . Smoking status: Never Smoker  . Smokeless tobacco: Never Used  . Alcohol use 0.6 oz/week    1 Glasses of wine per week  . Drug use: No  . Sexual activity: Not on file

## 2016-12-04 ENCOUNTER — Telehealth: Payer: Self-pay | Admitting: Rheumatology

## 2016-12-04 NOTE — Telephone Encounter (Signed)
Patient needs a copy of all her xrays that she has had done with [place] to take to [facility] Monday am. Patient will pick up CD Monday am before appt. Please call patient if there is a problem ,or concen. If not she will be here to pick up CD Monday.

## 2016-12-04 NOTE — Telephone Encounter (Signed)
I called patient, CD at front desk. 

## 2016-12-09 ENCOUNTER — Telehealth (INDEPENDENT_AMBULATORY_CARE_PROVIDER_SITE_OTHER): Payer: Self-pay | Admitting: Orthopaedic Surgery

## 2016-12-09 NOTE — Telephone Encounter (Signed)
Patient request a call back to discuss a few things with doctor about scheduling a surgery for her rt knee. Please call to advise.

## 2016-12-10 NOTE — Telephone Encounter (Signed)
Please advise 

## 2016-12-11 NOTE — Telephone Encounter (Signed)
called

## 2016-12-29 ENCOUNTER — Ambulatory Visit (INDEPENDENT_AMBULATORY_CARE_PROVIDER_SITE_OTHER): Payer: Medicare Other | Admitting: Orthopaedic Surgery

## 2016-12-29 ENCOUNTER — Encounter (INDEPENDENT_AMBULATORY_CARE_PROVIDER_SITE_OTHER): Payer: Self-pay | Admitting: Orthopaedic Surgery

## 2016-12-29 VITALS — BP 121/74 | HR 76 | Resp 16 | Ht 62.0 in | Wt 142.0 lb

## 2016-12-29 DIAGNOSIS — M170 Bilateral primary osteoarthritis of knee: Secondary | ICD-10-CM | POA: Diagnosis not present

## 2016-12-29 MED ORDER — BUPIVACAINE HCL 0.5 % IJ SOLN
3.0000 mL | INTRAMUSCULAR | Status: AC | PRN
  Administered 2016-12-29: 3 mL via INTRA_ARTICULAR

## 2016-12-29 MED ORDER — METHYLPREDNISOLONE ACETATE 40 MG/ML IJ SUSP
40.0000 mg | INTRAMUSCULAR | Status: AC | PRN
  Administered 2016-12-29: 40 mg via INTRA_ARTICULAR

## 2016-12-29 MED ORDER — METHYLPREDNISOLONE ACETATE 40 MG/ML IJ SUSP
80.0000 mg | INTRAMUSCULAR | Status: AC | PRN
  Administered 2016-12-29: 80 mg

## 2016-12-29 MED ORDER — LIDOCAINE HCL 1 % IJ SOLN
5.0000 mL | INTRAMUSCULAR | Status: AC | PRN
  Administered 2016-12-29: 5 mL

## 2016-12-29 NOTE — Progress Notes (Signed)
Office Visit Note   Patient: Angela Collier           Date of Birth: [birth year]           MRN: [id number] Visit Date: 12/29/2016              Requested by: Mayra Neer, MD [address] [place], [place] [address] PCP: Mayra Neer, MD   Assessment & Plan: Visit Diagnoses:  1. Osteoarthritis of both knees     Plan: cortisone injections both knees in anticipation of trip to [place] next week.Follow up as needed  Follow-Up Instructions: Return if symptoms worsen or fail to improve.   Orders:  No orders of the defined types were placed in this encounter.  No orders of the defined types were placed in this encounter.     Procedures: Large Joint Inj Date/Time: 12/29/2016 3:33 PM Performed by: Garald Balding Authorized by: Garald Balding   Consent Given by:  Patient Timeout: prior to procedure the correct patient, procedure, and site was verified   Indications:  Pain and joint swelling Location:  Knee Site:  L knee Prep: patient was prepped and draped in usual sterile fashion   Needle Size:  25 G Needle Length:  1.5 inches Approach:  Anteromedial Ultrasound Guidance: No   Fluoroscopic Guidance: No   Arthrogram: No   Medications:  3 mL bupivacaine 0.5 %; 5 mL lidocaine 1 %; 40 mg methylPREDNISolone acetate 40 MG/ML Aspiration Attempted: No   Patient tolerance:  Patient tolerated the procedure well with no immediate complications  Large Joint Inj Date/Time: 12/29/2016 3:35 PM Performed by: Garald Balding Authorized by: Garald Balding   Consent Given by:  Patient Timeout: prior to procedure the correct patient, procedure, and site was verified   Indications:  Pain and joint swelling Location:  Knee Site:  R knee Prep: patient was prepped and draped in usual sterile fashion   Needle Size:  25 G Needle Length:  1.5 inches Approach:  Anteromedial Ultrasound Guidance: No   Fluoroscopic Guidance: No   Arthrogram: No   Medications:   5 mL lidocaine 1 %; 80 mg methylPREDNISolone acetate 40 MG/ML; 3 mL bupivacaine 0.5 %; 40 mg methylPREDNISolone acetate 40 MG/ML Aspiration Attempted: No   Patient tolerance:  Patient tolerated the procedure well with no immediate complications     Clinical Data: No additional findings.   Subjective: Chief Complaint  Patient presents with  . Right Knee - Pain, Edema  . Left Knee - Pain, Edema  prior diagnosis of end-stage osteoarthritis by film. Long discussion regarding different treatment options. She would like to have bilateral knee injections in anticipation of her trip to [place].  HPI  Review of Systems  Constitutional: Negative for chills, fatigue and fever.  Eyes: Negative for itching.  Respiratory: Negative for chest tightness and shortness of breath.   Cardiovascular: Negative for chest pain, palpitations and leg swelling.  Gastrointestinal: Negative for blood in stool, constipation and diarrhea.  Endocrine: Negative for polyuria.  Genitourinary: Negative for dysuria.  Musculoskeletal: Positive for joint swelling. Negative for back pain, neck pain and neck stiffness.  Allergic/Immunologic: Negative for immunocompromised state.  Neurological: Negative for dizziness and numbness.  Hematological: Does not bruise/bleed easily.  Psychiatric/Behavioral: The patient is not nervous/anxious.      Objective: Vital Signs: BP 121/74   Pulse 76   Resp 16   Ht 5\' 2"  (1.575 m)   Wt 142 lb (64.4 kg)   BMI  25.97 kg/m   Physical Exam  Ortho Examsmall effusions both knees. Full extension and symmetrical flexion both knees. No instability. No calf pain or popliteal mass. No swelling distally. Neurovascular exam intact.more medial than lateral joint pain both knees associated with patella crepitation  Specialty Comments:  No specialty comments available.  Imaging: No results found.   PMFS History: Patient Active Problem List   Diagnosis Date Noted  . Age-related  osteoporosis without current pathological fracture 08/12/2016  . Osteoarthritis of both knees 02/24/2016  . Knee effusion, right 02/24/2016  . Patellar luxation 02/24/2016  . Osteoarthritis of both feet 02/24/2016  . Osteoarthritis of both hands 02/24/2016  . Macular degeneration 02/24/2016  . Varicose veins of lower extremities with other complications 25/85/2778   Past Medical History:  Diagnosis Date  . Hyperlipidemia   . Macular degeneration   . Osteopenia   . Osteoporosis   . Varicose veins     Family History  Problem Relation Age of Onset  . Osteoarthritis Mother   . Cancer Father        liver  . Heart disease Father   . Hyperlipidemia Father   . Hypertension Father   . Heart attack Father   . Other Father        varicose veins  . Hyperlipidemia Sister     Past Surgical History:  Procedure Laterality Date  . CATARACT EXTRACTION Right    2011  . PARTIAL HYSTERECTOMY    . WRIST FRACTURE SURGERY Bilateral 2004   Social History   Occupational History  . Not on file.   Social History Main Topics  . Smoking status: Never Smoker  . Smokeless tobacco: Never Used  . Alcohol use 0.6 oz/week    1 Glasses of wine per week  . Drug use: No  . Sexual activity: Not on file

## 2016-12-30 ENCOUNTER — Ambulatory Visit (INDEPENDENT_AMBULATORY_CARE_PROVIDER_SITE_OTHER): Payer: Medicare Other | Admitting: Orthopedic Surgery

## 2017-01-31 NOTE — Progress Notes (Signed)
Office Visit Note  Patient: Angela Collier             Date of Birth: 1945/06/10           MRN: [id number]             PCP: Mayra Neer, MD Referring: Mayra Neer, MD Visit Date: 02/10/2017 Occupation: @GUAROCC @    Subjective:  Osteoporosis and knee pain.   History of Present Illness: Angela Collier is a 71 y.o. female with history of seronegative inflammatory arthritis and osteoarthritis overlap. Since her last visit she's seen Dr. Durward Fortes and discussed bilateral total knee replacements. She states she went to [place] in September and had cortisone injections to her bilateral knee joints prior to her visit which helped her during the trip. She also went to see Dr. Manuella Ghazi, and rheumatologist at [facility]. Evaluated patient and felt that her symptoms could be rheumatoid arthritis and  osteoarthritis overlap. He felt that rheumatoid arthritis is less likely because she does not have significant morning stiffness and other joint involvement. He was in agreement the patient should go with a totally replacement and if arthritis spreads to her other joints then she may consider DMARD. She states that she's been continuing to have discomfort in her bilateral knee joints and also in her hands. She feels that her grip strength is decreased.  Activities of Daily Living:  Patient reports morning stiffness for 0 minute.   Patient Denies nocturnal pain.  Difficulty dressing/grooming: Denies Difficulty climbing stairs: Reports Difficulty getting out of chair: Reports Difficulty using hands for taps, buttons, cutlery, and/or writing: Reports   Review of Systems  Constitutional: Negative for fatigue, night sweats, weight gain, weight loss and weakness.  HENT: Positive for mouth sores. Negative for trouble swallowing, trouble swallowing, mouth dryness and nose dryness.   Eyes: Positive for dryness. Negative for pain, redness and visual disturbance.  Respiratory: Negative for cough,  shortness of breath and difficulty breathing.   Cardiovascular: Negative for chest pain, palpitations, hypertension, irregular heartbeat and swelling in legs/feet.  Gastrointestinal: Negative for blood in stool, constipation, diarrhea and nausea.  Endocrine: Negative for cold intolerance and increased urination.  Genitourinary: Negative for pelvic pain and vaginal dryness.  Musculoskeletal: Positive for arthralgias, joint pain and joint swelling. Negative for myalgias, muscle weakness, morning stiffness, muscle tenderness and myalgias.  Skin: Negative for color change, rash, hair loss, skin tightness, ulcers and sensitivity to sunlight.  Allergic/Immunologic: Negative for susceptible to infections.  Neurological: Negative for dizziness, light-headedness, memory loss and night sweats.  Hematological: Negative for bruising/bleeding tendency and swollen glands.  Psychiatric/Behavioral: Negative for depressed mood and sleep disturbance. The patient is not nervous/anxious.     PMFS History:  Patient Active Problem List   Diagnosis Date Noted  . Age-related osteoporosis without current pathological fracture 08/12/2016  . Osteoarthritis of both knees 02/24/2016  . Knee effusion, right 02/24/2016  . Patellar luxation 02/24/2016  . Osteoarthritis of both feet 02/24/2016  . Osteoarthritis of both hands 02/24/2016  . Macular degeneration 02/24/2016  . Varicose veins of lower extremities with other complications 78/29/5621    Past Medical History:  Diagnosis Date  . Hyperlipidemia   . Macular degeneration   . Osteopenia   . Osteoporosis   . Varicose veins     Family History  Problem Relation Age of Onset  . Osteoarthritis Mother   . Cancer Father        liver  . Heart disease Father   . Hyperlipidemia Father   .  Hypertension Father   . Heart attack Father   . Other Father        varicose veins  . Hyperlipidemia Sister    Past Surgical History:  Procedure Laterality Date  .  CATARACT EXTRACTION Right    2011  . FRACTURE SURGERY     BIL wrist  . PARTIAL HYSTERECTOMY    . WRIST FRACTURE SURGERY Bilateral 2004   Social History   Social History Narrative  . No narrative on file     Objective: Vital Signs: BP 107/77 (BP Location: Left Arm, Patient Position: Sitting, Cuff Size: Normal)   Pulse 70   Ht 5\' 3"  (1.6 m)   Wt 139 lb (63 kg)   BMI 24.62 kg/m    Physical Exam  Constitutional: She is oriented to person, place, and time. She appears well-developed and well-nourished.  HENT:  Head: Normocephalic and atraumatic.  Eyes: Conjunctivae and EOM are normal.  Neck: Normal range of motion.  Cardiovascular: Normal rate, regular rhythm, normal heart sounds and intact distal pulses.   Pulmonary/Chest: Effort normal and breath sounds normal.  Abdominal: Soft. Bowel sounds are normal.  Lymphadenopathy:    She has no cervical adenopathy.  Neurological: She is alert and oriented to person, place, and time.  Skin: Skin is warm and dry. Capillary refill takes less than 2 seconds.  Psychiatric: She has a normal mood and affect. Her behavior is normal.  Nursing note and vitals reviewed.    Musculoskeletal Exam: C-spine and thoracic lumbar spine good range of motion. Shoulder joints elbow joints are good range of motion. She has some thickening of PIP/DIP joints and also some thickening of MCP joints but no active synovitis was noted. Hip joints are good range of motion. She has thickening of bilateral knee joints but no synovitis or effusion was noted today.  CDAI Exam: No CDAI exam completed.    Investigation: No additional findings.   Imaging: No results found.  Speciality Comments: No specialty comments available.    Procedures:  No procedures performed Allergies: Sulfa antibiotics and Sulfasalazine   Assessment / Plan:     Visit Diagnoses: Inflammatory arthritis - seronegative. Patient had history of recurrent knee joint effusion in the past  which is been aspirated several times. She also had some synovial thickening in her hands but no active synovitis. She went for second appear in a 2 and was seen by rheumatologist Dr. Manuella Ghazi who was in agreement and felt that DMARD's can be tried if her arthritis gets worse. I've given her a handout on methotrexate to review. She can contact us if her arthritis gets worse.  Primary osteoarthritis of both hands: She has some joint stiffness and decreased grip strength due to underlying arthritis.  Primary osteoarthritis of both knees - severe chondromalacia patellae, right knee joint recurrent effusion (all autoimmune workup negative). She's planning total knee replacement in future if her symptoms persist.  Primary osteoarthritis of both feet - calcaneal spurs  Patellar dislocation, subsequent encounter - Bilateral   Age-related osteoporosis without current pathological fracture - Patient reports recent diagnosis of osteoporosis. She does not want to take any treatment for osteoporosis and wants to try natural therapy.    Orders: No orders of the defined types were placed in this encounter.  No orders of the defined types were placed in this encounter.   Face-to-face time spent with patient was 30 minutes. Greater than 50% of time was spent in counseling and coordination of care.  Follow-Up Instructions: Return  in about 6 months (around 08/10/2017) for Osteoarthritis, inflammatory arthritis.   Bo Merino, MD  Note - This record has been created using Editor, commissioning.  Chart creation errors have been sought, but may not always  have been located. Such creation errors do not reflect on  the standard of medical care.

## 2017-02-03 IMAGING — MR MR KNEE*R* W/O CM
4 of 6 series · 19 of 40 positions shown · non-contrast
Comparison: None.

CLINICAL DATA: Persistent right knee effusion.

EXAM:
MRI OF THE RIGHT KNEE WITHOUT CONTRAST
TECHNIQUE: Multiplanar, multisequence MR imaging of the knee was performed. No
intravenous contrast was administered.

[Series 4: PD fat-sat · axial · 4.0mm · 0.29mm/px · z∈[-31,+92]mm · 8 of 26 slices shown (1 of 4)]
[im 1/26]
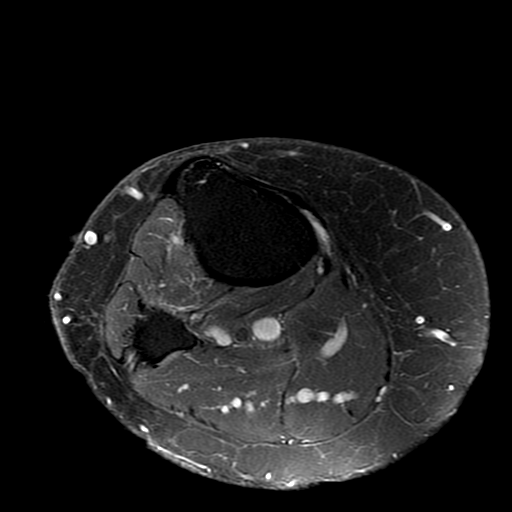
[im 4/26]
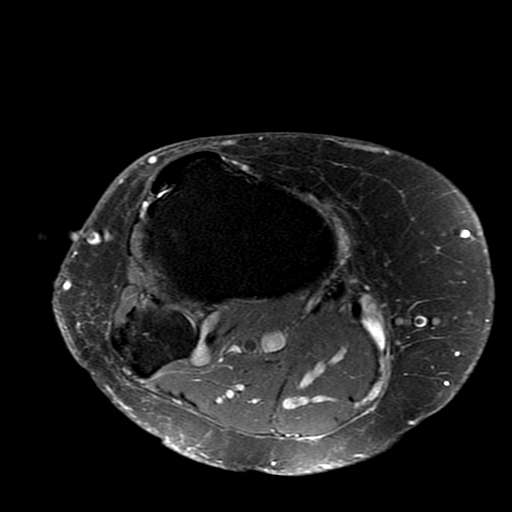
[im 8/26]
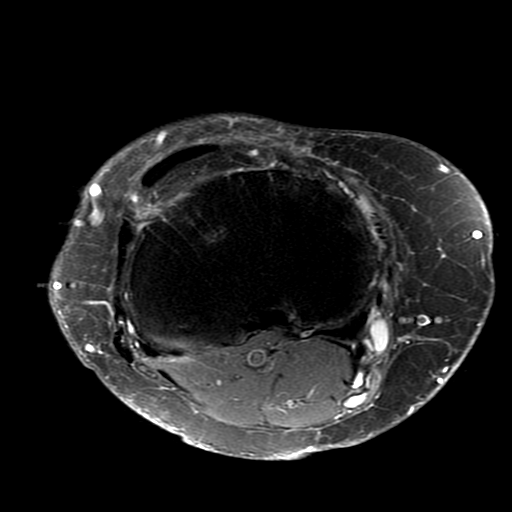
[im 11/26]
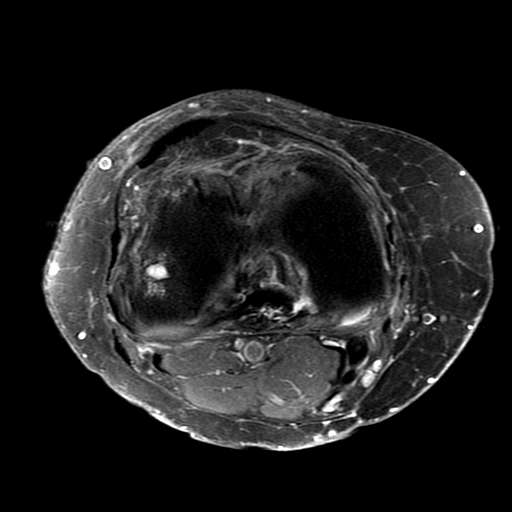
[im 15/26]
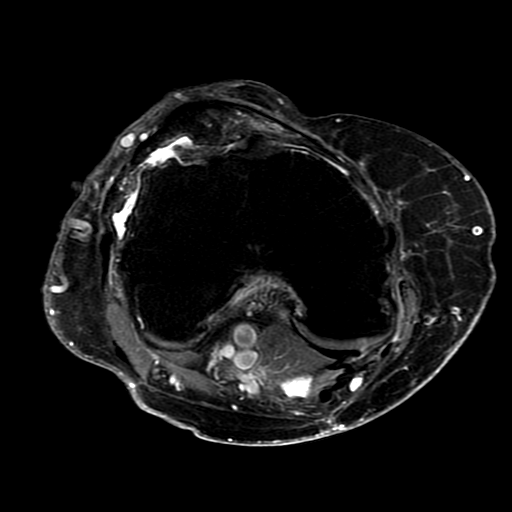
[im 18/26]
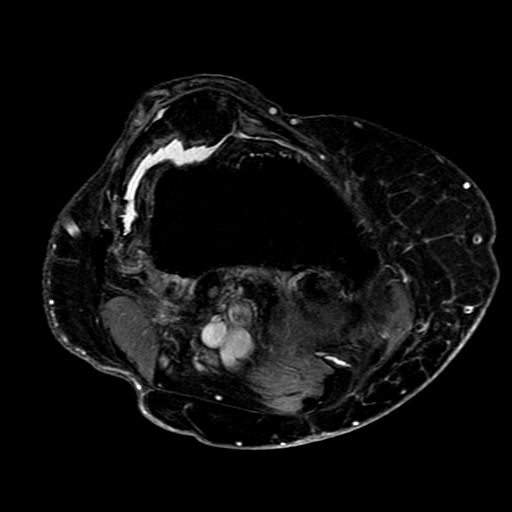
[im 22/26]
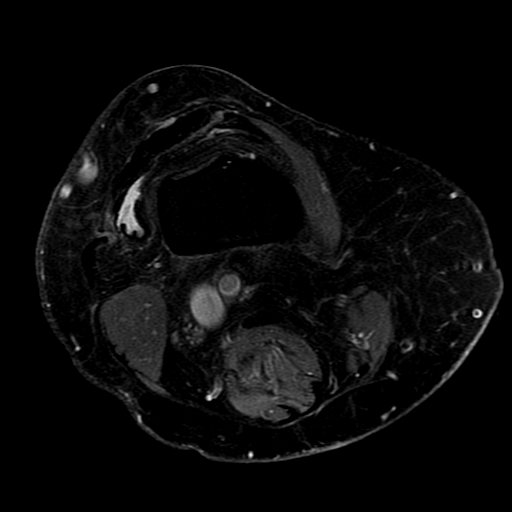
[im 26/26]
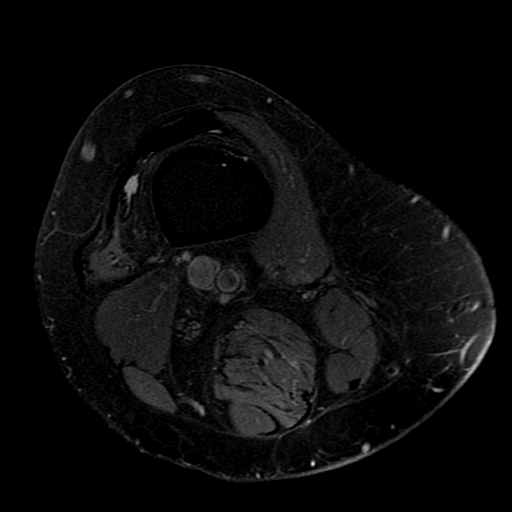

[Series 5: PD fat-sat · coronal · 4.0mm · 0.31mm/px · 5 of 23 slices shown (2 of 4)]
[im 1/23]
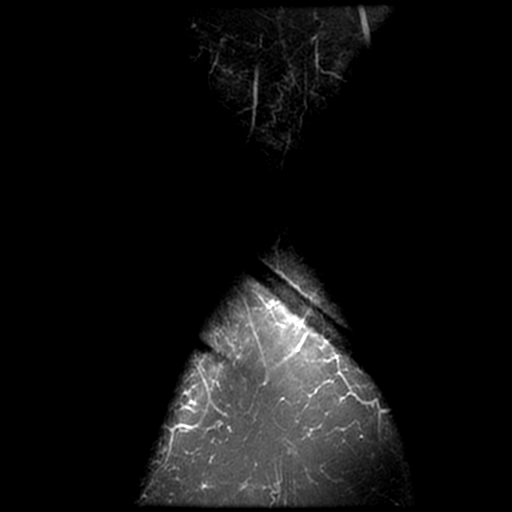
[im 4/23]
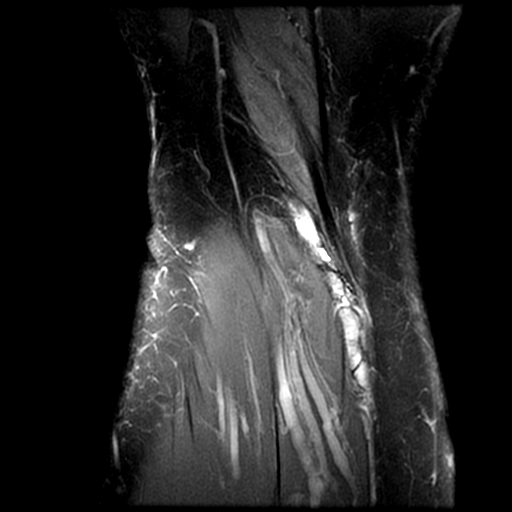
[im 8/23]
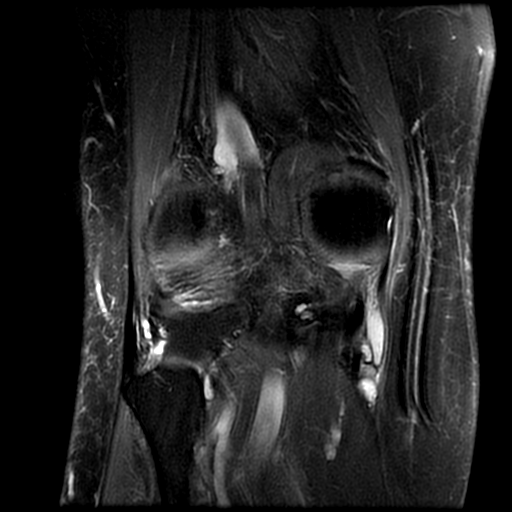
[im 12/23]
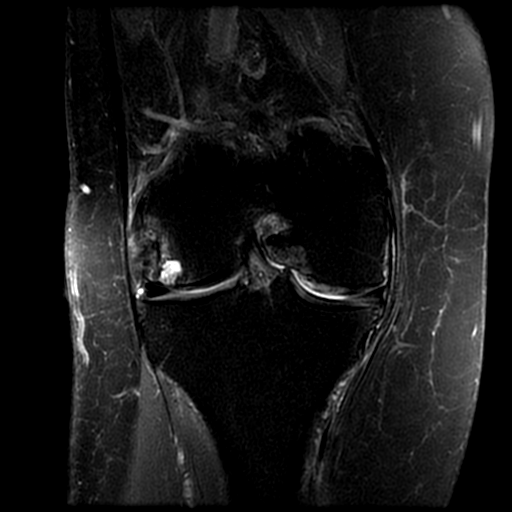
[im 19/23]
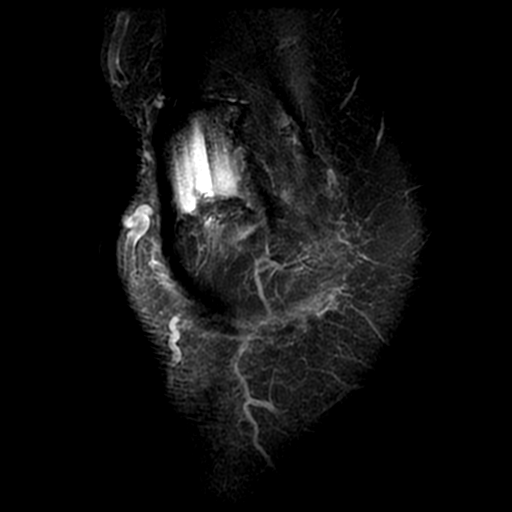

[Series 8: PD fat-sat · sagittal · 4.0mm · 0.31mm/px · 3 of 24 slices shown (3 of 4)]
[im 4/24]
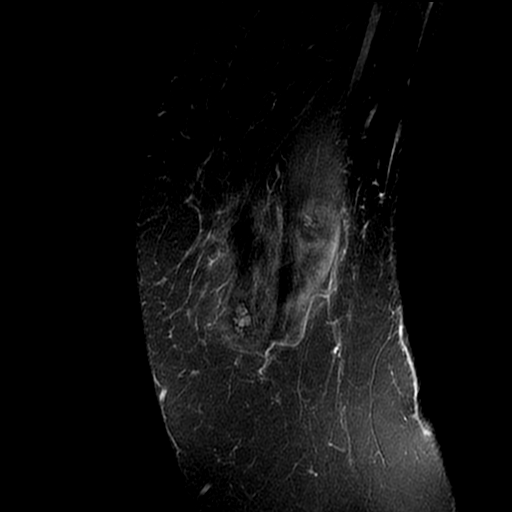
[im 12/24]
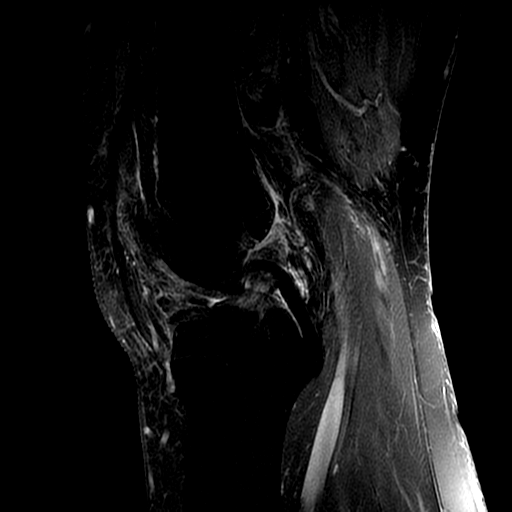
[im 20/24]
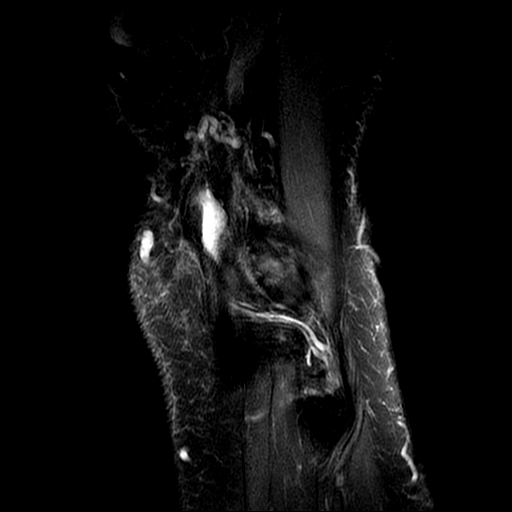

[Series 9: PD fat-sat · coronal · 2.0mm · 0.31mm/px · 3 of 12 slices shown (4 of 4)]
[im 1/12]
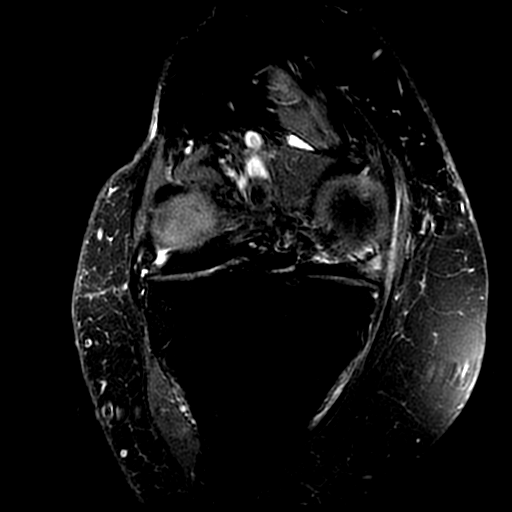
[im 8/12]
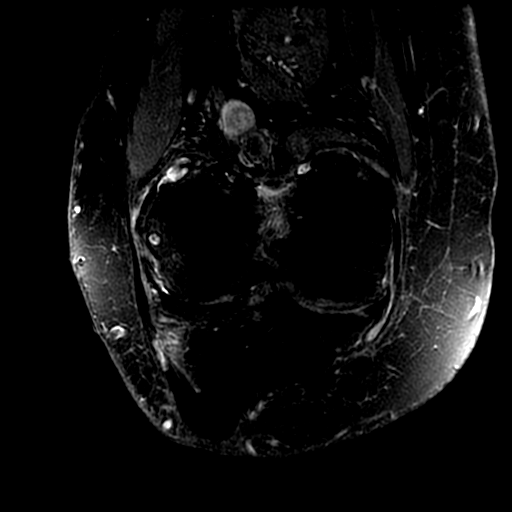
[im 12/12]
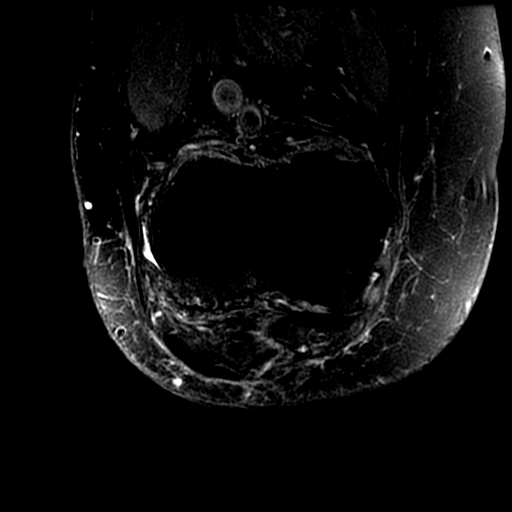

[19 of 40 positions shown; findings below may reference images not displayed]

FINDINGS: MENISCI

Medial meniscus: There is degeneration of the posterior horn and
midbody with fraying of the free edge with a subtle small focal
undersurface horizontal tear which extends to the periphery with a
tiny parameniscal cyst best seen on image 17 of series 8.

Lateral meniscus:  Intact.

LIGAMENTS

Cruciates:  Normal.

Collaterals:  Normal.

CARTILAGE

Patellofemoral: Complete denuding of the articular cartilage of the
patella extensive chondromalacia of the trochlear groove of the
distal femur. The upper pole of the patella has chronically eroded
the anterior cortex of the distal shaft of the femur.

Medial: Slight thinning of the articular cartilage of the posterior
central aspect of the femoral condyle.

Lateral: Focal grade 4 chondromalacia of the mid periphery of the
femoral condyle.

Joint: Minimal joint effusion. 8 mm loose body just medial to the
posterior cruciate ligament.

Popliteal Fossa:  Small complex Baker's cyst.

Extensor Mechanism:  Intact.  Lateral subluxation of the patella.

Bones:  Small tricompartmental marginal osteophytes.
IMPRESSION: 1. Severe chondromalacia and osteoarthritis of the patellofemoral
compartment.
2. Degeneration of the midbody and posterior horn of the medial
meniscus with a small focal horizontal undersurface tear of the
posterior horn.
3. Grade 4 chondromalacia of the periphery of the mid lateral
femoral condyle.
4. Minimal joint effusion.  Complex small Baker's cyst.

## 2017-02-10 ENCOUNTER — Ambulatory Visit (INDEPENDENT_AMBULATORY_CARE_PROVIDER_SITE_OTHER): Payer: Medicare Other | Admitting: Rheumatology

## 2017-02-10 ENCOUNTER — Encounter: Payer: Self-pay | Admitting: Rheumatology

## 2017-02-10 VITALS — BP 107/77 | HR 70 | Ht 63.0 in | Wt 139.0 lb

## 2017-02-10 DIAGNOSIS — S83006D Unspecified dislocation of unspecified patella, subsequent encounter: Secondary | ICD-10-CM

## 2017-02-10 DIAGNOSIS — M19071 Primary osteoarthritis, right ankle and foot: Secondary | ICD-10-CM

## 2017-02-10 DIAGNOSIS — M1990 Unspecified osteoarthritis, unspecified site: Secondary | ICD-10-CM | POA: Diagnosis not present

## 2017-02-10 DIAGNOSIS — M810 Age-related osteoporosis without current pathological fracture: Secondary | ICD-10-CM

## 2017-02-10 DIAGNOSIS — M1380 Other specified arthritis, unspecified site: Secondary | ICD-10-CM

## 2017-02-10 DIAGNOSIS — M19072 Primary osteoarthritis, left ankle and foot: Secondary | ICD-10-CM | POA: Diagnosis not present

## 2017-02-10 DIAGNOSIS — M19041 Primary osteoarthritis, right hand: Secondary | ICD-10-CM

## 2017-02-10 DIAGNOSIS — M19042 Primary osteoarthritis, left hand: Secondary | ICD-10-CM

## 2017-02-10 DIAGNOSIS — M170 Bilateral primary osteoarthritis of knee: Secondary | ICD-10-CM | POA: Diagnosis not present

## 2017-02-10 NOTE — Patient Instructions (Signed)
Methotrexate tablets What is this medicine? METHOTREXATE (METH oh TREX ate) is a chemotherapy drug used to treat cancer including breast cancer, leukemia, and lymphoma. This medicine can also be used to treat psoriasis and certain kinds of arthritis. This medicine may be used for other purposes; ask your health care provider or pharmacist if you have questions. COMMON BRAND NAME(S): Rheumatrex, Trexall What should I tell my health care provider before I take this medicine? They need to know if you have any of these conditions: -fluid in the stomach area or lungs -if you often drink alcohol -infection or immune system problems -kidney disease or on hemodialysis -liver disease -low blood counts, like low white cell, platelet, or red cell counts -lung disease -radiation therapy -stomach ulcers -ulcerative colitis -an unusual or allergic reaction to methotrexate, other medicines, foods, dyes, or preservatives -pregnant or trying to get pregnant -breast-feeding How should I use this medicine? Take this medicine by mouth with a glass of water. Follow the directions on the prescription label. Take your medicine at regular intervals. Do not take it more often than directed. Do not stop taking except on your doctor's advice. Make sure you know why you are taking this medicine and how often you should take it. If this medicine is used for a condition that is not cancer, like arthritis or psoriasis, it should be taken weekly, NOT daily. Taking this medicine more often than directed can cause serious side effects, even death. Talk to your healthcare provider about safe handling and disposal of this medicine. You may need to take special precautions. Talk to your pediatrician regarding the use of this medicine in children. While this drug may be prescribed for selected conditions, precautions do apply. Overdosage: If you think you have taken too much of this medicine contact a poison control center or  emergency room at once. NOTE: This medicine is only for you. Do not share this medicine with others. What if I miss a dose? If you miss a dose, talk with your doctor or health care professional. Do not take double or extra doses. What may interact with this medicine? This medicine may interact with the following medication: -acitretin -aspirin and aspirin-like medicines including salicylates -azathioprine -certain antibiotics like penicillins, tetracycline, and chloramphenicol -cyclosporine -gold -hydroxychloroquine -live virus vaccines -NSAIDs, medicines for pain and inflammation, like ibuprofen or naproxen -other cytotoxic agents -penicillamine -phenylbutazone -phenytoin -probenecid -retinoids such as isotretinoin and tretinoin -steroid medicines like prednisone or cortisone -sulfonamides like sulfasalazine and trimethoprim/sulfamethoxazole -theophylline This list may not describe all possible interactions. Give your health care provider a list of all the medicines, herbs, non-prescription drugs, or dietary supplements you use. Also tell them if you smoke, drink alcohol, or use illegal drugs. Some items may interact with your medicine. What should I watch for while using this medicine? Avoid alcoholic drinks. This medicine can make you more sensitive to the sun. Keep out of the sun. If you cannot avoid being in the sun, wear protective clothing and use sunscreen. Do not use sun lamps or tanning beds/booths. You may need blood work done while you are taking this medicine. Call your doctor or health care professional for advice if you get a fever, chills or sore throat, or other symptoms of a cold or flu. Do not treat yourself. This drug decreases your body's ability to fight infections. Try to avoid being around people who are sick. This medicine may increase your risk to bruise or bleed. Call your doctor or health care professional   if you notice any unusual bleeding. Check with your  doctor or health care professional if you get an attack of severe diarrhea, nausea and vomiting, or if you sweat a lot. The loss of too much body fluid can make it dangerous for you to take this medicine. Talk to your doctor about your risk of cancer. You may be more at risk for certain types of cancers if you take this medicine. Both men and women must use effective birth control with this medicine. Do not become pregnant while taking this medicine or until at least 1 normal menstrual cycle has occurred after stopping it. Women should inform their doctor if they wish to become pregnant or think they might be pregnant. Men should not father a child while taking this medicine and for 3 months after stopping it. There is a potential for serious side effects to an unborn child. Talk to your health care professional or pharmacist for more information. Do not breast-feed an infant while taking this medicine. What side effects may I notice from receiving this medicine? Side effects that you should report to your doctor or health care professional as soon as possible: -allergic reactions like skin rash, itching or hives, swelling of the face, lips, or tongue -breathing problems or shortness of breath -diarrhea -dry, nonproductive cough -low blood counts - this medicine may decrease the number of white blood cells, red blood cells and platelets. You may be at increased risk for infections and bleeding. -mouth sores -redness, blistering, peeling or loosening of the skin, including inside the mouth -signs of infection - fever or chills, cough, sore throat, pain or trouble passing urine -signs and symptoms of bleeding such as bloody or black, tarry stools; red or dark-brown urine; spitting up blood or brown material that looks like coffee grounds; red spots on the skin; unusual bruising or bleeding from the eye, gums, or nose -signs and symptoms of kidney injury like trouble passing urine or change in the amount  of urine -signs and symptoms of liver injury like dark yellow or brown urine; general ill feeling or flu-like symptoms; light-colored stools; loss of appetite; nausea; right upper belly pain; unusually weak or tired; yellowing of the eyes or skin Side effects that usually do not require medical attention (report to your doctor or health care professional if they continue or are bothersome): -dizziness -hair loss -tiredness -upset stomach -vomiting This list may not describe all possible side effects. Call your doctor for medical advice about side effects. You may report side effects to FDA at 1-800-FDA-1088. Where should I keep my medicine? Keep out of the reach of children. Store at room temperature between 20 and 25 degrees C (68 and 77 degrees F). Protect from light. Throw away any unused medicine after the expiration date. NOTE: This sheet is a summary. It may not cover all possible information. If you have questions about this medicine, talk to your doctor, pharmacist, or health care provider.  2018 Elsevier/Gold Standard (2014-12-03 05:39:22)  

## 2017-02-24 ENCOUNTER — Telehealth: Payer: Self-pay | Admitting: Rheumatology

## 2017-02-24 NOTE — Telephone Encounter (Signed)
LMOM for patient to pick up x-rays of BIL knees

## 2017-02-24 NOTE — Telephone Encounter (Signed)
Patient needs a copy of all xrays that she has had done here. Patient would like to pick this up today. Please call to advise.

## 2017-07-28 NOTE — Progress Notes (Signed)
Office Visit Note  Patient: Angela Collier             Date of Birth: 06-13-1945           MRN: [id number]             PCP: Mayra Neer, MD Referring: Mayra Neer, MD Visit Date: 08/11/2017 Occupation: @GUAROCC @    Subjective:  Swelling in knee joints.   History of Present Illness: Angela Collier is a 72 y.o. female with history of inflammatory arthritis, osteoarthritis and chondromalacia patella.  She states that she continues to have intermittent pain and discomfort in her knee joints with some swelling.  She had a core set of cortisone injections since September and then repeat cortisone injections in April.  She is planning to have total knee replacement due to lack of mobility and ongoing swelling.  The other joints are painful.  She does have osteoarthritis in her hands and feet.  Activities of Daily Living:  Patient reports morning stiffness for 0 minute.   Patient Denies nocturnal pain.  Difficulty dressing/grooming: Denies Difficulty climbing stairs: Reports Difficulty getting out of chair: Reports Difficulty using hands for taps, buttons, cutlery, and/or writing: Denies   Review of Systems  Constitutional: Negative for fatigue, night sweats, weight gain and weight loss.  HENT: Negative for mouth sores, trouble swallowing, trouble swallowing, mouth dryness and nose dryness.   Eyes: Negative for pain, redness, visual disturbance and dryness.  Respiratory: Negative for cough, shortness of breath and difficulty breathing.   Cardiovascular: Negative for chest pain, palpitations, hypertension, irregular heartbeat and swelling in legs/feet.  Gastrointestinal: Negative for blood in stool, constipation and diarrhea.  Endocrine: Negative for increased urination.  Genitourinary: Negative for vaginal dryness.  Musculoskeletal: Positive for arthralgias and joint pain. Negative for joint swelling, myalgias, muscle weakness, morning stiffness, muscle tenderness and  myalgias.  Skin: Negative for color change, rash, hair loss, skin tightness, ulcers and sensitivity to sunlight.  Allergic/Immunologic: Negative for susceptible to infections.  Neurological: Negative for dizziness, memory loss, night sweats and weakness.  Hematological: Negative for swollen glands.  Psychiatric/Behavioral: Negative for depressed mood and sleep disturbance. The patient is not nervous/anxious.     PMFS History:  Patient Active Problem List   Diagnosis Date Noted  . Age-related osteoporosis without current pathological fracture 08/12/2016  . Osteoarthritis of both knees 02/24/2016  . Knee effusion, right 02/24/2016  . Patellar luxation 02/24/2016  . Osteoarthritis of both feet 02/24/2016  . Osteoarthritis of both hands 02/24/2016  . Macular degeneration 02/24/2016  . Varicose veins of lower extremities with other complications 78/29/5621    Past Medical History:  Diagnosis Date  . Hyperlipidemia   . Macular degeneration   . Osteopenia   . Osteoporosis   . Varicose veins     Family History  Problem Relation Age of Onset  . Osteoarthritis Mother   . Rheum arthritis Mother   . Cancer Father        liver  . Heart disease Father   . Hyperlipidemia Father   . Hypertension Father   . Heart attack Father   . Other Father        varicose veins  . Hyperlipidemia Sister   . Healthy Son   . Healthy Daughter    Past Surgical History:  Procedure Laterality Date  . CATARACT EXTRACTION Right    2011  . FRACTURE SURGERY     BIL wrist  . PARTIAL HYSTERECTOMY    . WRIST FRACTURE SURGERY Bilateral  2004   Social History   Social History Narrative  . Not on file     Objective: Vital Signs: BP 129/82 (BP Location: Left Arm, Patient Position: Sitting, Cuff Size: Normal)   Pulse 74   Resp 12   Ht 5\' 3"  (1.6 m)   Wt 141 lb (64 kg)   BMI 24.98 kg/m    Physical Exam  Constitutional: She is oriented to person, place, and time. She appears well-developed and  well-nourished.  HENT:  Head: Normocephalic and atraumatic.  Eyes: Conjunctivae and EOM are normal.  Neck: Normal range of motion.  Cardiovascular: Normal rate, regular rhythm, normal heart sounds and intact distal pulses.  Pulmonary/Chest: Effort normal and breath sounds normal.  Abdominal: Soft. Bowel sounds are normal.  Lymphadenopathy:    She has no cervical adenopathy.  Neurological: She is alert and oriented to person, place, and time.  Skin: Skin is warm and dry. Capillary refill takes less than 2 seconds.  Psychiatric: She has a normal mood and affect. Her behavior is normal.  Nursing note and vitals reviewed.    Musculoskeletal Exam: C-spine thoracic lumbar spine good range of motion.  Shoulder joints elbow joints wrist joints with good range of motion.  She has DIP PIP thickening with subluxation of several of her DIPs PIPs.  She had a mucinous cyst on her left fourth DIP.  Hip joints were in good range of motion.  She has good range of motion of her knee joints with crepitus in bilateral knee joints without any warmth swelling or effusion.  She has subluxation of most of her MTPs and PIP joints due to osteoarthritis.  CDAI Exam: No CDAI exam completed.    Investigation: No additional findings.   Imaging: No results found.  Speciality Comments: No specialty comments available.    Procedures:  No procedures performed Allergies: Sulfa antibiotics and Sulfasalazine   Assessment / Plan:     Visit Diagnoses: Inflammatory arthritis - seronegative. Patient had history of recurrent knee joint effusion in the past which is been aspirated several times.  Besides her knee joints she does not have inflammation in any of the joints.  She continues to have intermittent swelling in her knees and problems with mobility.  She is planning to have bilateral total knee replacement this fall.  Primary osteoarthritis of both hands-she has osteoarthritis in her hands with subluxation of  PIP and DIP joints.  She has lost 1 of the ring a splinter from her left fifth digit.  She will be [facility] .  Primary osteoarthritis of both knees - severe chondromalacia patellae, right knee joint recurrent effusion (all autoimmune workup negative).  Primary osteoarthritis of both feet - calcaneal spurs.  Proper fitting shoes were discussed.  Patellar dislocation, unspecified laterality, subsequent encounter -  Bilateral   Age-related osteoporosis without current pathological fracture -she is currently on calcium and vitamin D and practicing muscle strengthening exercises.  Orders: No orders of the defined types were placed in this encounter.  No orders of the defined types were placed in this encounter.   Face-to-face time spent with patient was 30 minutes. Greater than50% of time was spent in counseling and coordination of care.  Follow-Up Instructions: Return in about 1 year (around 08/12/2018) for Osteoarthritis, Osteoporosis.   Bo Merino, MD  Note - This record has been created using Editor, commissioning.  Chart creation errors have been sought, but may not always  have been located. Such creation errors  do not reflect on  the standard of medical care.

## 2017-08-11 ENCOUNTER — Encounter: Payer: Self-pay | Admitting: Rheumatology

## 2017-08-11 ENCOUNTER — Ambulatory Visit: Payer: Medicare Other | Admitting: Rheumatology

## 2017-08-11 VITALS — BP 129/82 | HR 74 | Resp 12 | Ht 63.0 in | Wt 141.0 lb

## 2017-08-11 DIAGNOSIS — M19071 Primary osteoarthritis, right ankle and foot: Secondary | ICD-10-CM

## 2017-08-11 DIAGNOSIS — M19072 Primary osteoarthritis, left ankle and foot: Secondary | ICD-10-CM

## 2017-08-11 DIAGNOSIS — M1380 Other specified arthritis, unspecified site: Secondary | ICD-10-CM

## 2017-08-11 DIAGNOSIS — M810 Age-related osteoporosis without current pathological fracture: Secondary | ICD-10-CM

## 2017-08-11 DIAGNOSIS — M1990 Unspecified osteoarthritis, unspecified site: Secondary | ICD-10-CM

## 2017-08-11 DIAGNOSIS — M19042 Primary osteoarthritis, left hand: Secondary | ICD-10-CM

## 2017-08-11 DIAGNOSIS — S83006D Unspecified dislocation of unspecified patella, subsequent encounter: Secondary | ICD-10-CM | POA: Diagnosis not present

## 2017-08-11 DIAGNOSIS — M19041 Primary osteoarthritis, right hand: Secondary | ICD-10-CM

## 2017-08-11 DIAGNOSIS — M170 Bilateral primary osteoarthritis of knee: Secondary | ICD-10-CM

## 2017-12-21 NOTE — H&P (Addendum)
TOTAL KNEE ADMISSION H&P  Patient is being admitted for right total knee arthroplasty.  Subjective:  Chief Complaint:right knee pain.  HPI: Angela Collier, 72 y.o. female, has a history of pain and functional disability in the right knee due to arthritis and has failed non-surgical conservative treatments for greater than 12 weeks to includeNSAID's and/or analgesics, corticosteriod injections and activity modification.  Onset of symptoms was gradual, starting several years ago with gradually worsening course since that time. The patient noted no past surgery on the right knee(s).  Patient currently rates pain in the right knee(s) at 8 out of 10 with activity. Patient has worsening of pain with activity and weight bearing, crepitus and limited range of motion.  Patient has evidence of severe patellofemoral arthritis with severe patellar erosion by imaging studies. There is no active infection.  Patient Active Problem List   Diagnosis Date Noted  . Age-related osteoporosis without current pathological fracture 08/12/2016  . Osteoarthritis of both knees 02/24/2016  . Knee effusion, right 02/24/2016  . Patellar luxation 02/24/2016  . Osteoarthritis of both feet 02/24/2016  . Osteoarthritis of both hands 02/24/2016  . Macular degeneration 02/24/2016  . Varicose veins of lower extremities with other complications 19/41/7408   Past Medical History:  Diagnosis Date  . Hyperlipidemia   . Macular degeneration   . Osteopenia   . Osteoporosis   . Varicose veins     Past Surgical History:  Procedure Laterality Date  . CATARACT EXTRACTION Right    2011  . FRACTURE SURGERY     BIL wrist  . PARTIAL HYSTERECTOMY    . WRIST FRACTURE SURGERY Bilateral 2004    No current facility-administered medications for this encounter.    Current Outpatient Medications  Medication Sig Dispense Refill Last Dose  . aspirin 81 MG chewable tablet Chew 81 mg by mouth as needed.    Not Taking  . B Complex-C  (SUPER B COMPLEX PO) Take by mouth every other day.   Taking  . Calcium Citrate 250 MG TABS Take by mouth daily.   Taking  . cholecalciferol (VITAMIN D) 1000 units tablet Take 2,000 Units by mouth daily.    Taking  . co-enzyme Q-10 30 MG capsule Take 30 mg by mouth 3 (three) times daily.   Taking  . magnesium 30 MG tablet Take 30 mg by mouth daily.   Not Taking  . Magnesium 400 MG CAPS Take by mouth daily.   Taking  . magnesium oxide (MAG-OX) 400 MG tablet Take by mouth daily.   Not Taking  . Multiple Vitamins-Minerals (MULTIVITAMIN WITH MINERALS) tablet Take 1 tablet by mouth daily.   Not Taking  . Omega 3 1200 MG CAPS Take by mouth daily.   Taking  . Red Yeast Rice 600 MG CAPS Take by mouth.   Taking  . Specialty Vitamins Products (ICAPS LUTEIN-ZEAXANTHIN PO) Take by mouth.   Taking  . Turmeric 450 MG CAPS Take by mouth.   Taking  . vitamin E 100 UNIT capsule Take by mouth daily.   Taking   Allergies  Allergen Reactions  . Sulfa Antibiotics   . Sulfasalazine Itching    Social History   Tobacco Use  . Smoking status: Never Smoker  . Smokeless tobacco: Never Used  Substance Use Topics  . Alcohol use: Yes    Alcohol/week: 1.0 standard drinks    Types: 1 Glasses of wine per week    Comment: rarely    Family History  Problem Relation Age of  Onset  . Osteoarthritis Mother   . Rheum arthritis Mother   . Cancer Father        liver  . Heart disease Father   . Hyperlipidemia Father   . Hypertension Father   . Heart attack Father   . Other Father        varicose veins  . Hyperlipidemia Sister   . Healthy Son   . Healthy Daughter      Review of Systems  Constitutional: Negative for chills and fever.  HENT: Negative for congestion, sore throat and tinnitus.   Eyes: Negative for double vision, photophobia and pain.  Respiratory: Negative for cough, shortness of breath and wheezing.   Cardiovascular: Negative for chest pain, palpitations and orthopnea.  Gastrointestinal:  Negative for heartburn, nausea and vomiting.  Genitourinary: Negative for dysuria, frequency and urgency.  Musculoskeletal: Positive for joint pain.  Neurological: Negative for dizziness, weakness and headaches.  Psychiatric/Behavioral: Negative for depression.    Objective:  Physical Exam  Well nourished and well developed. General: Alert and oriented x3, cooperative and pleasant, no acute distress. Head: normocephalic, atraumatic, neck supple. Eyes: EOMI. Respiratory: breath sounds clear in all fields, no wheezing, rales, or rhonchi. Cardiovascular: Regular rate and rhythm, no murmurs, gallops or rubs.  Abdomen: non-tender to palpation and soft, normoactive bowel sounds. Musculoskeletal: Antalgic gait on the right without using assisted devices. Patient has a fracture of the proximal phalanx of the right great toe.  Right Knee Exam: No effusion. Range of motion is 0-135 degrees. Marked crepitus on range of motion of the knee. Slight medial tenderness, no lateral joint line tenderness. Slight tenderness anteriorly. Stable knee. Left Knee Exam: No effusion. Range of motion is 0-135 degrees. There is crepitus on range of motion of the knee, slightly less than the right. No medial or lateral joint line tenderness. Stable knee. Bilateral Hip Exam: ROM:is normal. There is no tenderness over the greater trochanter.  Calves soft and nontender. Motor function intact in LE. Strength 5/5 LE bilaterally. Neuro: Distal pulses 2+. Sensation to light touch intact in LE.  Vital signs in last 24 hours: Blood pressure: 146/86 mmHg Pulse: 72 bpm   Labs:   Estimated body mass index is 24.98 kg/m as calculated from the following:   Height as of 08/11/17: 5\' 3"  (1.6 m).   Weight as of 08/11/17: 64 kg.   Imaging Review Plain radiographs demonstrate severe degenerative joint disease of the right knee(s). The overall alignment isneutral. The bone quality appears to be adequate for age and reported  activity level.   Preoperative templating of the joint replacement has been completed, documented, and submitted to the Operating Room personnel in order to optimize intra-operative equipment management.   Anticipated LOS equal to or greater than 2 midnights due to - Age 5 and older with one or more of the following:  - Obesity  - Expected need for hospital services (PT, OT, Nursing) required for safe  discharge  - Anticipated need for postoperative skilled nursing care or inpatient rehab  - Active co-morbidities: None OR   - Unanticipated findings during/Post Surgery: None  - Patient is a high risk of re-admission due to: None     Assessment/Plan:  End stage arthritis, right knee   The patient history, physical examination, clinical judgment of the provider and imaging studies are consistent with end stage degenerative joint disease of the right knee(s) and total knee arthroplasty is deemed medically necessary. The treatment options including medical management, injection therapy arthroscopy  and arthroplasty were discussed at length. The risks and benefits of total knee arthroplasty were presented and reviewed. The risks due to aseptic loosening, infection, stiffness, patella tracking problems, thromboembolic complications and other imponderables were discussed. The patient acknowledged the explanation, agreed to proceed with the plan and consent was signed. Patient is being admitted for inpatient treatment for surgery, pain control, PT, OT, prophylactic antibiotics, VTE prophylaxis, progressive ambulation and ADL's and discharge planning. The patient is planning to be discharged home with outpatient physical therapy.   Therapy Plans: HHPT followed by outpatient [facility] at [facility] Disposition: Home with husband Planned DVT Prophylaxis: Xarelto 10 mg daily (hx of cervical CA) DME needed: walker, 3-n-1 PCP: Dr. Serita Grammes (medical clearance provided) TXA: IV Allergies: Sulfa  (itching) Anesthesia Concerns: None Other: Pt would prefer to complete HHPT prior to outpatient therapy due to cost with insurance.  - Patient was instructed on what medications to stop prior to surgery. - Follow-up visit in 2 weeks with Dr. Wynelle Link - Begin physical therapy following surgery - Pre-operative lab work as pre-surgical testing - Prescriptions will be provided in hospital at time of discharge  Theresa Duty, PA-C Orthopedic Surgery [facility] [place]

## 2018-01-03 NOTE — Patient Instructions (Signed)
Angela Collier  01/03/2018   Your procedure is scheduled on: 01-10-18   Report to [facility] Main  Entrance    Report to admitting at 9:00AM    Call this number if you have problems the morning of surgery [phone]     Remember: Do not eat food or drink liquids :After Midnight. BRUSH YOUR TEETH MORNING OF SURGERY AND RINSE YOUR MOUTH OUT, NO CHEWING GUM CANDY OR MINTS.     Take these medicines the morning of surgery with A SIP OF WATER: none                                You may not have any metal on your body including hair pins and              piercings  Do not wear jewelry, make-up, lotions, powders or perfumes, deodorant             Do not wear nail polish.  Do not shave  48 hours prior to surgery.              Do not bring valuables to the hospital. [place].  Contacts, dentures or bridgework may not be worn into surgery.  Leave suitcase in the car. After surgery it may be brought to your room.                 Please read over the following fact sheets you were given: _____________________________________________________________________             [facility] - Preparing for Surgery Before surgery, you can play an important role.  Because skin is not sterile, your skin needs to be as free of germs as possible.  You can reduce the number of germs on your skin by washing with CHG (chlorahexidine gluconate) soap before surgery.  CHG is an antiseptic cleaner which kills germs and bonds with the skin to continue killing germs even after washing. Please DO NOT use if you have an allergy to CHG or antibacterial soaps.  If your skin becomes reddened/irritated stop using the CHG and inform your nurse when you arrive at Short Stay. Do not shave (including legs and underarms) for at least 48 hours prior to the first CHG shower.  You may shave your face/neck. Please follow these instructions  carefully:  1.  Shower with CHG Soap the night before surgery and the  morning of Surgery.  2.  If you choose to wash your hair, wash your hair first as usual with your  normal  shampoo.  3.  After you shampoo, rinse your hair and body thoroughly to remove the  shampoo.                           4.  Use CHG as you would any other liquid soap.  You can apply chg directly  to the skin and wash                       Gently with a scrungie or clean washcloth.  5.  Apply the CHG Soap to your body ONLY FROM THE NECK DOWN.   Do not use on  face/ open                           Wound or open sores. Avoid contact with eyes, ears mouth and genitals (private parts).                       Wash face,  Genitals (private parts) with your normal soap.             6.  Wash thoroughly, paying special attention to the area where your surgery  will be performed.  7.  Thoroughly rinse your body with warm water from the neck down.  8.  DO NOT shower/wash with your normal soap after using and rinsing off  the CHG Soap.                9.  Pat yourself dry with a clean towel.            10.  Wear clean pajamas.            11.  Place clean sheets on your bed the night of your first shower and do not  sleep with pets. Day of Surgery : Do not apply any lotions/deodorants the morning of surgery.  Please wear clean clothes to the hospital/surgery center.  FAILURE TO FOLLOW THESE INSTRUCTIONS MAY RESULT IN THE CANCELLATION OF YOUR SURGERY PATIENT SIGNATURE_________________________________  NURSE SIGNATURE__________________________________  ________________________________________________________________________   Angela Collier  An incentive spirometer is a tool that can help keep your lungs clear and active. This tool measures how well you are filling your lungs with each breath. Taking long deep breaths may help reverse or decrease the chance of developing breathing (pulmonary) problems (especially infection)  following:  A long period of time when you are unable to move or be active. BEFORE THE PROCEDURE   If the spirometer includes an indicator to show your best effort, your nurse or respiratory therapist will set it to a desired goal.  If possible, sit up straight or lean slightly forward. Try not to slouch.  Hold the incentive spirometer in an upright position. INSTRUCTIONS FOR USE  1. Sit on the edge of your bed if possible, or sit up as far as you can in bed or on a chair. 2. Hold the incentive spirometer in an upright position. 3. Breathe out normally. 4. Place the mouthpiece in your mouth and seal your lips tightly around it. 5. Breathe in slowly and as deeply as possible, raising the piston or the ball toward the top of the column. 6. Hold your breath for 3-5 seconds or for as long as possible. Allow the piston or ball to fall to the bottom of the column. 7. Remove the mouthpiece from your mouth and breathe out normally. 8. Rest for a few seconds and repeat Steps 1 through 7 at least 10 times every 1-2 hours when you are awake. Take your time and take a few normal breaths between deep breaths. 9. The spirometer may include an indicator to show your best effort. Use the indicator as a goal to work toward during each repetition. 10. After each set of 10 deep breaths, practice coughing to be sure your lungs are clear. If you have an incision (the cut made at the time of surgery), support your incision when coughing by placing a pillow or rolled up towels firmly against it. Once you are able to get out of bed, walk around indoors  and cough well. You may stop using the incentive spirometer when instructed by your caregiver.  RISKS AND COMPLICATIONS  Take your time so you do not get dizzy or light-headed.  If you are in pain, you may need to take or ask for pain medication before doing incentive spirometry. It is harder to take a deep breath if you are having pain. AFTER USE  Rest and  breathe slowly and easily.  It can be helpful to keep track of a log of your progress. Your caregiver can provide you with a simple table to help with this. If you are using the spirometer at home, follow these instructions: [place] IF:   You are having difficultly using the spirometer.  You have trouble using the spirometer as often as instructed.  Your pain medication is not giving enough relief while using the spirometer.  You develop fever of 100.5 F (38.1 C) or higher. SEEK IMMEDIATE MEDICAL CARE IF:   You cough up bloody sputum that had not been present before.  You develop fever of 102 F (38.9 C) or greater.  You develop worsening pain at or near the incision site. MAKE SURE YOU:   Understand these instructions.  Will watch your condition.  Will get help right away if you are not doing well or get worse. Document Released: 08/10/2006 Document Revised: 06/22/2011 Document Reviewed: 10/11/2006 Exit[employer] Patient Information 2014 [employer], [place].   ________________________________________________________________________  WHAT IS A BLOOD TRANSFUSION? Blood Transfusion Information  A transfusion is the replacement of blood or some of its parts. Blood is made up of multiple cells which provide different functions.  Red blood cells carry oxygen and are used for blood loss replacement.  White blood cells fight against infection.  Platelets control bleeding.  Plasma helps clot blood.  Other blood products are available for specialized needs, such as hemophilia or other clotting disorders. BEFORE THE TRANSFUSION  Who gives blood for transfusions?   Healthy volunteers who are fully evaluated to make sure their blood is safe. This is blood bank blood. Transfusion therapy is the safest it has ever been in the practice of medicine. Before blood is taken from a donor, a complete history is taken to make sure that person has no history of diseases nor engages in  risky social behavior (examples are intravenous drug use or sexual activity with multiple partners). The donor's travel history is screened to minimize risk of transmitting infections, such as malaria. The donated blood is tested for signs of infectious diseases, such as HIV and hepatitis. The blood is then tested to be sure it is compatible with you in order to minimize the chance of a transfusion reaction. If you or a relative donates blood, this is often done in anticipation of surgery and is not appropriate for emergency situations. It takes many days to process the donated blood. RISKS AND COMPLICATIONS Although transfusion therapy is very safe and saves many lives, the main dangers of transfusion include:   Getting an infectious disease.  Developing a transfusion reaction. This is an allergic reaction to something in the blood you were given. Every precaution is taken to prevent this. The decision to have a blood transfusion has been considered carefully by your caregiver before blood is given. Blood is not given unless the benefits outweigh the risks. AFTER THE TRANSFUSION  Right after receiving a blood transfusion, you will usually feel much better and more energetic. This is especially true if your red blood cells have gotten low (  anemic). The transfusion raises the level of the red blood cells which carry oxygen, and this usually causes an energy increase.  The nurse administering the transfusion will monitor you carefully for complications. HOME CARE INSTRUCTIONS  No special instructions are needed after a transfusion. You may find your energy is better. Speak with your caregiver about any limitations on activity for underlying diseases you may have. SEEK MEDICAL CARE IF:   Your condition is not improving after your transfusion.  You develop redness or irritation at the intravenous (IV) site. SEEK IMMEDIATE MEDICAL CARE IF:  Any of the following symptoms occur over the next 12  hours:  Shaking chills.  You have a temperature by mouth above 102 F (38.9 C), not controlled by medicine.  Chest, back, or muscle pain.  People around you feel you are not acting correctly or are confused.  Shortness of breath or difficulty breathing.  Dizziness and fainting.  You get a rash or develop hives.  You have a decrease in urine output.  Your urine turns a dark color or changes to pink, red, or brown. Any of the following symptoms occur over the next 10 days:  You have a temperature by mouth above 102 F (38.9 C), not controlled by medicine.  Shortness of breath.  Weakness after normal activity.  The white part of the eye turns yellow (jaundice).  You have a decrease in the amount of urine or are urinating less often.  Your urine turns a dark color or changes to pink, red, or brown. Document Released: 03/27/2000 Document Revised: 06/22/2011 Document Reviewed: 11/14/2007 [facility] Patient Information 2014 [place], [place].  _______________________________________________________________________

## 2018-01-03 NOTE — Progress Notes (Signed)
Surgical clearance Dr Derrill Memo 12-07-17 on chart   EKG 12-07-17 on chart from [place] 12-07-17 on chart from [facility]

## 2018-01-05 ENCOUNTER — Other Ambulatory Visit: Payer: Self-pay

## 2018-01-05 ENCOUNTER — Encounter (HOSPITAL_COMMUNITY): Payer: Self-pay

## 2018-01-05 ENCOUNTER — Encounter (HOSPITAL_COMMUNITY)
Admission: RE | Admit: 2018-01-05 | Discharge: 2018-01-05 | Disposition: A | Discharge status: Home / Self Care | Payer: Medicare Other | Source: Ambulatory Visit | Attending: Orthopedic Surgery | Admitting: Orthopedic Surgery

## 2018-01-05 DIAGNOSIS — Z01818 Encounter for other preprocedural examination: Secondary | ICD-10-CM | POA: Insufficient documentation

## 2018-01-05 DIAGNOSIS — M1711 Unilateral primary osteoarthritis, right knee: Secondary | ICD-10-CM | POA: Diagnosis not present

## 2018-01-05 HISTORY — DX: Unspecified osteoarthritis, unspecified site: M19.90

## 2018-01-05 HISTORY — DX: Pneumonia, unspecified organism: J18.9

## 2018-01-05 LAB — CBC
HCT: 44.7 % (ref 36.0–46.0)
HEMOGLOBIN: 15.2 g/dL — AB (ref 12.0–15.0)
MCH: 30.6 pg (ref 26.0–34.0)
MCHC: 34 g/dL (ref 30.0–36.0)
MCV: 89.9 fL (ref 78.0–100.0)
Platelets: 158 10*3/uL (ref 150–400)
RBC: 4.97 MIL/uL (ref 3.87–5.11)
RDW: 12.7 % (ref 11.5–15.5)
WBC: 5.3 10*3/uL (ref 4.0–10.5)

## 2018-01-05 LAB — COMPREHENSIVE METABOLIC PANEL
ALK PHOS: 44 U/L (ref 38–126)
ALT: 24 U/L (ref 0–44)
AST: 26 U/L (ref 15–41)
Albumin: 4.6 g/dL (ref 3.5–5.0)
Anion gap: 11 (ref 5–15)
BUN: 15 mg/dL (ref 8–23)
CALCIUM: 9.7 mg/dL (ref 8.9–10.3)
CHLORIDE: 107 mmol/L (ref 98–111)
CO2: 26 mmol/L (ref 22–32)
CREATININE: 0.86 mg/dL (ref 0.44–1.00)
GFR calc non Af Amer: >60 mL/min (ref >60)
Glucose, Bld: 100 mg/dL — ABNORMAL HIGH (ref 70–99)
Potassium: 4.9 mmol/L (ref 3.5–5.1)
SODIUM: 144 mmol/L (ref 135–145)
Total Bilirubin: 1.1 mg/dL (ref 0.3–1.2)
Total Protein: 6.9 g/dL (ref 6.5–8.1)

## 2018-01-05 LAB — APTT: aPTT: 27 seconds (ref 24–36)

## 2018-01-05 LAB — SURGICAL PCR SCREEN
MRSA, PCR: NEGATIVE
STAPHYLOCOCCUS AUREUS: NEGATIVE

## 2018-01-05 LAB — PROTIME-INR
INR: 0.88
PROTHROMBIN TIME: 11.9 s (ref 11.4–15.2)

## 2018-01-05 LAB — ABO/RH: ABO/RH(D): A POS

## 2018-01-09 MED ORDER — BUPIVACAINE LIPOSOME 1.3 % IJ SUSP
20.0000 mL | Freq: Once | INTRAMUSCULAR | Status: DC
  Filled 2018-01-09: qty 20

## 2018-01-10 ENCOUNTER — Ambulatory Visit (HOSPITAL_COMMUNITY): Payer: Medicare Other | Admitting: Anesthesiology

## 2018-01-10 ENCOUNTER — Other Ambulatory Visit: Payer: Self-pay

## 2018-01-10 ENCOUNTER — Observation Stay (HOSPITAL_COMMUNITY)
Admission: AD | Admit: 2018-01-10 | Discharge: 2018-01-12 | Disposition: A | Discharge status: Home / Self Care | Payer: Medicare Other | Source: Other Acute Inpatient Hospital | Attending: Orthopedic Surgery | Admitting: Orthopedic Surgery

## 2018-01-10 ENCOUNTER — Encounter (HOSPITAL_COMMUNITY): Payer: Self-pay | Admitting: *Deleted

## 2018-01-10 ENCOUNTER — Encounter (HOSPITAL_COMMUNITY)
Admission: AD | Disposition: A | Discharge status: Home / Self Care | Payer: Self-pay | Source: Other Acute Inpatient Hospital | Attending: Orthopedic Surgery

## 2018-01-10 DIAGNOSIS — E669 Obesity, unspecified: Secondary | ICD-10-CM | POA: Insufficient documentation

## 2018-01-10 DIAGNOSIS — Z79899 Other long term (current) drug therapy: Secondary | ICD-10-CM | POA: Insufficient documentation

## 2018-01-10 DIAGNOSIS — M1990 Unspecified osteoarthritis, unspecified site: Secondary | ICD-10-CM | POA: Diagnosis not present

## 2018-01-10 DIAGNOSIS — M179 Osteoarthritis of knee, unspecified: Secondary | ICD-10-CM | POA: Diagnosis present

## 2018-01-10 DIAGNOSIS — Z882 Allergy status to sulfonamides status: Secondary | ICD-10-CM | POA: Diagnosis not present

## 2018-01-10 DIAGNOSIS — Z6824 Body mass index (BMI) 24.0-24.9, adult: Secondary | ICD-10-CM | POA: Diagnosis not present

## 2018-01-10 DIAGNOSIS — E785 Hyperlipidemia, unspecified: Secondary | ICD-10-CM | POA: Diagnosis not present

## 2018-01-10 DIAGNOSIS — M170 Bilateral primary osteoarthritis of knee: Secondary | ICD-10-CM | POA: Diagnosis present

## 2018-01-10 DIAGNOSIS — M1710 Unilateral primary osteoarthritis, unspecified knee: Secondary | ICD-10-CM | POA: Diagnosis present

## 2018-01-10 DIAGNOSIS — M810 Age-related osteoporosis without current pathological fracture: Secondary | ICD-10-CM | POA: Insufficient documentation

## 2018-01-10 DIAGNOSIS — Z7982 Long term (current) use of aspirin: Secondary | ICD-10-CM | POA: Insufficient documentation

## 2018-01-10 DIAGNOSIS — M8580 Other specified disorders of bone density and structure, unspecified site: Secondary | ICD-10-CM | POA: Diagnosis not present

## 2018-01-10 DIAGNOSIS — M1711 Unilateral primary osteoarthritis, right knee: Secondary | ICD-10-CM | POA: Diagnosis present

## 2018-01-10 HISTORY — PX: TOTAL KNEE ARTHROPLASTY: SHX125

## 2018-01-10 LAB — TYPE AND SCREEN
ABO/RH(D): A POS
Antibody Screen: NEGATIVE

## 2018-01-10 SURGERY — ARTHROPLASTY, KNEE, TOTAL
Anesthesia: Monitor Anesthesia Care | Site: Knee | Laterality: Right

## 2018-01-10 MED ORDER — CEFAZOLIN SODIUM-DEXTROSE 2-4 GM/100ML-% IV SOLN
INTRAVENOUS | Status: AC
  Filled 2018-01-10: qty 100

## 2018-01-10 MED ORDER — EPHEDRINE 5 MG/ML INJ
INTRAVENOUS | Status: AC
  Filled 2018-01-10: qty 10

## 2018-01-10 MED ORDER — SODIUM CHLORIDE 0.9 % IJ SOLN
INTRAMUSCULAR | Status: DC | PRN
  Administered 2018-01-10: 60 mL

## 2018-01-10 MED ORDER — ROPIVACAINE HCL 7.5 MG/ML IJ SOLN
INTRAMUSCULAR | Status: DC | PRN
  Administered 2018-01-10: 20 mL via PERINEURAL

## 2018-01-10 MED ORDER — SODIUM CHLORIDE 0.9 % IJ SOLN
INTRAMUSCULAR | Status: AC
  Filled 2018-01-10: qty 50

## 2018-01-10 MED ORDER — SODIUM CHLORIDE 0.9 % IR SOLN
Status: DC | PRN
  Administered 2018-01-10: 1000 mL

## 2018-01-10 MED ORDER — CLONIDINE HCL (ANALGESIA) 100 MCG/ML EP SOLN
EPIDURAL | Status: DC | PRN
  Administered 2018-01-10: 50 ug

## 2018-01-10 MED ORDER — ONDANSETRON HCL 4 MG/2ML IJ SOLN
INTRAMUSCULAR | Status: DC | PRN
  Administered 2018-01-10: 4 mg via INTRAVENOUS

## 2018-01-10 MED ORDER — FENTANYL CITRATE (PF) 100 MCG/2ML IJ SOLN
50.0000 ug | INTRAMUSCULAR | Status: DC
  Administered 2018-01-10 (×2): 50 ug via INTRAVENOUS
  Filled 2018-01-10: qty 2

## 2018-01-10 MED ORDER — LIDOCAINE 2% (20 MG/ML) 5 ML SYRINGE
INTRAMUSCULAR | Status: AC
  Filled 2018-01-10: qty 5

## 2018-01-10 MED ORDER — TRANEXAMIC ACID 1000 MG/10ML IV SOLN
1000.0000 mg | INTRAVENOUS | Status: AC
  Administered 2018-01-10: 1000 mg via INTRAVENOUS
  Filled 2018-01-10: qty 10

## 2018-01-10 MED ORDER — PROPOFOL 10 MG/ML IV BOLUS
INTRAVENOUS | Status: AC
  Filled 2018-01-10: qty 40

## 2018-01-10 MED ORDER — RIVAROXABAN 10 MG PO TABS
10.0000 mg | ORAL_TABLET | Freq: Every day | ORAL | Status: DC
  Administered 2018-01-11 – 2018-01-12 (×2): 10 mg via ORAL
  Filled 2018-01-10 (×2): qty 1

## 2018-01-10 MED ORDER — FLEET ENEMA 7-19 GM/118ML RE ENEM: 1.0000 | ENEMA | Freq: Once | RECTAL | Status: DC | PRN

## 2018-01-10 MED ORDER — METHOCARBAMOL 500 MG PO TABS
500.0000 mg | ORAL_TABLET | Freq: Four times a day (QID) | ORAL | Status: DC | PRN
  Administered 2018-01-11 – 2018-01-12 (×3): 500 mg via ORAL
  Filled 2018-01-10 (×3): qty 1

## 2018-01-10 MED ORDER — OXYCODONE HCL 5 MG PO TABS
5.0000 mg | ORAL_TABLET | ORAL | Status: DC | PRN
  Administered 2018-01-10 – 2018-01-11 (×4): 5 mg via ORAL
  Administered 2018-01-11: 10 mg via ORAL
  Administered 2018-01-11 – 2018-01-12 (×3): 5 mg via ORAL
  Filled 2018-01-10: qty 1
  Filled 2018-01-10: qty 2
  Filled 2018-01-10 (×6): qty 1

## 2018-01-10 MED ORDER — CEFAZOLIN SODIUM-DEXTROSE 1-4 GM/50ML-% IV SOLN
1.0000 g | Freq: Four times a day (QID) | INTRAVENOUS | Status: AC
  Administered 2018-01-10 (×2): 1 g via INTRAVENOUS
  Filled 2018-01-10 (×3): qty 50

## 2018-01-10 MED ORDER — DEXAMETHASONE SODIUM PHOSPHATE 10 MG/ML IJ SOLN
INTRAMUSCULAR | Status: AC
  Filled 2018-01-10: qty 1

## 2018-01-10 MED ORDER — FENTANYL CITRATE (PF) 100 MCG/2ML IJ SOLN
25.0000 ug | INTRAMUSCULAR | Status: DC | PRN
  Administered 2018-01-10 (×2): 50 ug via INTRAVENOUS

## 2018-01-10 MED ORDER — ACETAMINOPHEN 500 MG PO TABS
1000.0000 mg | ORAL_TABLET | Freq: Four times a day (QID) | ORAL | Status: AC
  Administered 2018-01-10 – 2018-01-11 (×4): 1000 mg via ORAL
  Filled 2018-01-10 (×4): qty 2

## 2018-01-10 MED ORDER — PROPOFOL 500 MG/50ML IV EMUL
INTRAVENOUS | Status: DC | PRN
  Administered 2018-01-10: 100 ug/kg/min via INTRAVENOUS

## 2018-01-10 MED ORDER — BISACODYL 10 MG RE SUPP: 10.0000 mg | Freq: Every day | RECTAL | Status: DC | PRN

## 2018-01-10 MED ORDER — METHOCARBAMOL 500 MG IVPB - SIMPLE MED
INTRAVENOUS | Status: AC
  Filled 2018-01-10: qty 50

## 2018-01-10 MED ORDER — TRAMADOL HCL 50 MG PO TABS: 50.0000 mg | ORAL_TABLET | Freq: Four times a day (QID) | ORAL | Status: DC | PRN

## 2018-01-10 MED ORDER — BUPIVACAINE IN DEXTROSE 0.75-8.25 % IT SOLN
INTRATHECAL | Status: DC | PRN
  Administered 2018-01-10: 1.6 mL via INTRATHECAL

## 2018-01-10 MED ORDER — METOCLOPRAMIDE HCL 5 MG/ML IJ SOLN: 5.0000 mg | Freq: Three times a day (TID) | INTRAMUSCULAR | Status: DC | PRN

## 2018-01-10 MED ORDER — MIDAZOLAM HCL 2 MG/2ML IJ SOLN
1.0000 mg | INTRAMUSCULAR | Status: DC
  Administered 2018-01-10: 1 mg via INTRAVENOUS
  Filled 2018-01-10: qty 2

## 2018-01-10 MED ORDER — EPHEDRINE SULFATE-NACL 50-0.9 MG/10ML-% IV SOSY
PREFILLED_SYRINGE | INTRAVENOUS | Status: DC | PRN
  Administered 2018-01-10 (×3): 5 mg via INTRAVENOUS
  Administered 2018-01-10: 10 mg via INTRAVENOUS
  Administered 2018-01-10: 5 mg via INTRAVENOUS

## 2018-01-10 MED ORDER — MENTHOL 3 MG MT LOZG: 1.0000 | LOZENGE | OROMUCOSAL | Status: DC | PRN

## 2018-01-10 MED ORDER — DEXAMETHASONE SODIUM PHOSPHATE 10 MG/ML IJ SOLN
8.0000 mg | Freq: Once | INTRAMUSCULAR | Status: AC
  Administered 2018-01-10: 10 mg via INTRAVENOUS

## 2018-01-10 MED ORDER — PHENYLEPHRINE 40 MCG/ML (10ML) SYRINGE FOR IV PUSH (FOR BLOOD PRESSURE SUPPORT)
PREFILLED_SYRINGE | INTRAVENOUS | Status: AC
  Filled 2018-01-10: qty 10

## 2018-01-10 MED ORDER — PHENOL 1.4 % MT LIQD
1.0000 | OROMUCOSAL | Status: DC | PRN
  Filled 2018-01-10: qty 177

## 2018-01-10 MED ORDER — POLYETHYLENE GLYCOL 3350 17 G PO PACK: 17.0000 g | PACK | Freq: Every day | ORAL | Status: DC | PRN

## 2018-01-10 MED ORDER — CEFAZOLIN SODIUM-DEXTROSE 2-4 GM/100ML-% IV SOLN
2.0000 g | INTRAVENOUS | Status: AC
  Administered 2018-01-10: 2 g via INTRAVENOUS
  Filled 2018-01-10: qty 100

## 2018-01-10 MED ORDER — DIPHENHYDRAMINE HCL 12.5 MG/5ML PO ELIX: 12.5000 mg | ORAL_SOLUTION | ORAL | Status: DC | PRN

## 2018-01-10 MED ORDER — DOCUSATE SODIUM 100 MG PO CAPS
100.0000 mg | ORAL_CAPSULE | Freq: Two times a day (BID) | ORAL | Status: DC
  Administered 2018-01-10 – 2018-01-12 (×4): 100 mg via ORAL
  Filled 2018-01-10 (×4): qty 1

## 2018-01-10 MED ORDER — ONDANSETRON HCL 4 MG/2ML IJ SOLN
INTRAMUSCULAR | Status: AC
  Filled 2018-01-10: qty 2

## 2018-01-10 MED ORDER — SODIUM CHLORIDE 0.9 % IV SOLN
INTRAVENOUS | Status: DC
  Administered 2018-01-10 – 2018-01-11 (×2): via INTRAVENOUS

## 2018-01-10 MED ORDER — ONDANSETRON HCL 4 MG PO TABS: 4.0000 mg | ORAL_TABLET | Freq: Four times a day (QID) | ORAL | Status: DC | PRN

## 2018-01-10 MED ORDER — SODIUM CHLORIDE 0.9 % IJ SOLN
INTRAMUSCULAR | Status: AC
  Filled 2018-01-10: qty 10

## 2018-01-10 MED ORDER — LIDOCAINE 2% (20 MG/ML) 5 ML SYRINGE
INTRAMUSCULAR | Status: DC | PRN
  Administered 2018-01-10: 60 mg via INTRAVENOUS

## 2018-01-10 MED ORDER — GABAPENTIN 300 MG PO CAPS
300.0000 mg | ORAL_CAPSULE | Freq: Once | ORAL | Status: AC
  Administered 2018-01-10: 300 mg via ORAL
  Filled 2018-01-10: qty 1

## 2018-01-10 MED ORDER — CHLORHEXIDINE GLUCONATE 4 % EX LIQD: 60.0000 mL | Freq: Once | CUTANEOUS | Status: DC

## 2018-01-10 MED ORDER — FENTANYL CITRATE (PF) 100 MCG/2ML IJ SOLN
INTRAMUSCULAR | Status: AC
  Filled 2018-01-10: qty 2

## 2018-01-10 MED ORDER — STERILE WATER FOR IRRIGATION IR SOLN
Status: DC | PRN
  Administered 2018-01-10: 2000 mL

## 2018-01-10 MED ORDER — ONDANSETRON HCL 4 MG/2ML IJ SOLN: 4.0000 mg | Freq: Four times a day (QID) | INTRAMUSCULAR | Status: DC | PRN

## 2018-01-10 MED ORDER — PHENYLEPHRINE 40 MCG/ML (10ML) SYRINGE FOR IV PUSH (FOR BLOOD PRESSURE SUPPORT)
PREFILLED_SYRINGE | INTRAVENOUS | Status: DC | PRN
  Administered 2018-01-10 (×2): 40 ug via INTRAVENOUS
  Administered 2018-01-10: 80 ug via INTRAVENOUS
  Administered 2018-01-10: 40 ug via INTRAVENOUS

## 2018-01-10 MED ORDER — METHOCARBAMOL 500 MG IVPB - SIMPLE MED
500.0000 mg | Freq: Four times a day (QID) | INTRAVENOUS | Status: DC | PRN
  Administered 2018-01-10: 500 mg via INTRAVENOUS
  Filled 2018-01-10: qty 50

## 2018-01-10 MED ORDER — DEXAMETHASONE SODIUM PHOSPHATE 10 MG/ML IJ SOLN
10.0000 mg | Freq: Once | INTRAMUSCULAR | Status: AC
  Administered 2018-01-11: 10 mg via INTRAVENOUS
  Filled 2018-01-10: qty 1

## 2018-01-10 MED ORDER — ACETAMINOPHEN 10 MG/ML IV SOLN
1000.0000 mg | Freq: Four times a day (QID) | INTRAVENOUS | Status: DC
  Administered 2018-01-10: 1000 mg via INTRAVENOUS
  Filled 2018-01-10: qty 100

## 2018-01-10 MED ORDER — HYDROMORPHONE HCL 1 MG/ML IJ SOLN
0.5000 mg | INTRAMUSCULAR | Status: DC | PRN
  Administered 2018-01-11: 1 mg via INTRAVENOUS
  Filled 2018-01-10: qty 1

## 2018-01-10 MED ORDER — BUPIVACAINE LIPOSOME 1.3 % IJ SUSP
INTRAMUSCULAR | Status: DC | PRN
  Administered 2018-01-10: 20 mL

## 2018-01-10 MED ORDER — VANCOMYCIN HCL IN DEXTROSE 1-5 GM/200ML-% IV SOLN
INTRAVENOUS | Status: AC
  Filled 2018-01-10: qty 200

## 2018-01-10 MED ORDER — LACTATED RINGERS IV SOLN
INTRAVENOUS | Status: DC
  Administered 2018-01-10 (×3): via INTRAVENOUS

## 2018-01-10 MED ORDER — METOCLOPRAMIDE HCL 5 MG PO TABS: 5.0000 mg | ORAL_TABLET | Freq: Three times a day (TID) | ORAL | Status: DC | PRN

## 2018-01-10 SURGICAL SUPPLY — 65 items
ATTUNE PS FEM RT SZ 5 CEM KNEE (Femur) ×3 IMPLANT
ATTUNE PSRP INSR SZ5 8 KNEE (Insert) ×2 IMPLANT
ATTUNE PSRP INSR SZ5 8MM KNEE (Insert) ×1 IMPLANT
BAG ZIPLOCK 12X15 (MISCELLANEOUS) ×3 IMPLANT
BANDAGE ACE 6X5 VEL STRL LF (GAUZE/BANDAGES/DRESSINGS) ×3 IMPLANT
BASE TIBIAL ROT PLAT SZ 5 KNEE (Knees) ×1 IMPLANT
BLADE SAG 18X100X1.27 (BLADE) ×3 IMPLANT
BLADE SAW SAG 90X13X1.27 (BLADE) ×3 IMPLANT
BLADE SAW SGTL 11.0X1.19X90.0M (BLADE) ×3 IMPLANT
BOWL SMART MIX CTS (DISPOSABLE) ×3 IMPLANT
CEMENT HV SMART SET (Cement) ×6 IMPLANT
CLOSURE WOUND 1/2 X4 (GAUZE/BANDAGES/DRESSINGS) ×1
COVER SURGICAL LIGHT HANDLE (MISCELLANEOUS) ×3 IMPLANT
CUFF TOURN SGL QUICK 34 (TOURNIQUET CUFF) ×2
CUFF TRNQT CYL 34X4X40X1 (TOURNIQUET CUFF) ×1 IMPLANT
DECANTER SPIKE VIAL GLASS SM (MISCELLANEOUS) ×6 IMPLANT
DRAPE U-SHAPE 47X51 STRL (DRAPES) ×3 IMPLANT
DRSG ADAPTIC 3X8 NADH LF (GAUZE/BANDAGES/DRESSINGS) ×3 IMPLANT
DRSG PAD ABDOMINAL 8X10 ST (GAUZE/BANDAGES/DRESSINGS) ×3 IMPLANT
DURAPREP 26ML APPLICATOR (WOUND CARE) ×3 IMPLANT
ELECT REM PT RETURN 15FT ADLT (MISCELLANEOUS) ×3 IMPLANT
EVACUATOR 1/8 PVC DRAIN (DRAIN) ×3 IMPLANT
GAUZE SPONGE 4X4 12PLY STRL (GAUZE/BANDAGES/DRESSINGS) ×3 IMPLANT
GLOVE BIO SURGEON STRL SZ7 (GLOVE) ×3 IMPLANT
GLOVE BIO SURGEON STRL SZ8 (GLOVE) ×3 IMPLANT
GLOVE BIOGEL PI IND STRL 6.5 (GLOVE) IMPLANT
GLOVE BIOGEL PI IND STRL 7.0 (GLOVE) ×2 IMPLANT
GLOVE BIOGEL PI IND STRL 7.5 (GLOVE) ×1 IMPLANT
GLOVE BIOGEL PI IND STRL 8 (GLOVE) ×1 IMPLANT
GLOVE BIOGEL PI INDICATOR 6.5 (GLOVE)
GLOVE BIOGEL PI INDICATOR 7.0 (GLOVE) ×4
GLOVE BIOGEL PI INDICATOR 7.5 (GLOVE) ×2
GLOVE BIOGEL PI INDICATOR 8 (GLOVE) ×2
GLOVE SURG SS PI 6.5 STRL IVOR (GLOVE) IMPLANT
GLOVE SURG SS PI 7.0 STRL IVOR (GLOVE) ×6 IMPLANT
GLOVE SURG SS PI 7.5 STRL IVOR (GLOVE) ×15 IMPLANT
GOWN SPEC L4 XLG W/TWL (GOWN DISPOSABLE) ×3 IMPLANT
GOWN STRL REUS W/TWL LRG LVL3 (GOWN DISPOSABLE) ×9 IMPLANT
GOWN STRL REUS W/TWL XL LVL3 (GOWN DISPOSABLE) ×3 IMPLANT
HANDPIECE INTERPULSE COAX TIP (DISPOSABLE) ×2
HOLDER FOLEY CATH W/STRAP (MISCELLANEOUS) ×3 IMPLANT
IMMOBILIZER KNEE 20 (SOFTGOODS) ×3
IMMOBILIZER KNEE 20 THIGH 36 (SOFTGOODS) ×1 IMPLANT
MANIFOLD NEPTUNE II (INSTRUMENTS) ×3 IMPLANT
NS IRRIG 1000ML POUR BTL (IV SOLUTION) ×3 IMPLANT
PACK TOTAL KNEE CUSTOM (KITS) ×3 IMPLANT
PAD ABD 8X10 STRL (GAUZE/BANDAGES/DRESSINGS) ×3 IMPLANT
PADDING CAST COTTON 6X4 STRL (CAST SUPPLIES) ×3 IMPLANT
PATELLA MEDIAL ATTUN 35MM KNEE (Knees) ×3 IMPLANT
PIN STEINMAN FIXATION KNEE (PIN) ×6 IMPLANT
PIN THREADED HEADED SIGMA (PIN) ×6 IMPLANT
POSITIONER SURGICAL ARM (MISCELLANEOUS) ×3 IMPLANT
SET HNDPC FAN SPRY TIP SCT (DISPOSABLE) ×1 IMPLANT
STRIP CLOSURE SKIN 1/2X4 (GAUZE/BANDAGES/DRESSINGS) ×2 IMPLANT
SUT MNCRL AB 4-0 PS2 18 (SUTURE) ×3 IMPLANT
SUT STRATAFIX 0 PDS 27 VIOLET (SUTURE) ×3
SUT VIC AB 2-0 CT1 27 (SUTURE) ×6
SUT VIC AB 2-0 CT1 TAPERPNT 27 (SUTURE) ×3 IMPLANT
SUTURE STRATFX 0 PDS 27 VIOLET (SUTURE) ×1 IMPLANT
TIBIAL BASE ROT PLAT SZ 5 KNEE (Knees) ×3 IMPLANT
TRAY FOLEY CATH 14FRSI W/METER (CATHETERS) ×3 IMPLANT
TRAY FOLEY MTR SLVR 16FR STAT (SET/KITS/TRAYS/PACK) IMPLANT
WATER STERILE IRR 1000ML POUR (IV SOLUTION) ×6 IMPLANT
WRAP KNEE MAXI GEL POST OP (GAUZE/BANDAGES/DRESSINGS) ×3 IMPLANT
YANKAUER SUCT BULB TIP 10FT TU (MISCELLANEOUS) ×3 IMPLANT

## 2018-01-10 NOTE — Progress Notes (Signed)
AssistedDr. Rob Fitzgerald with right, ultrasound guided, adductor canal block. Side rails up, monitors on throughout procedure. See vital signs in flow sheet. Tolerated Procedure well.  

## 2018-01-10 NOTE — Anesthesia Postprocedure Evaluation (Signed)
Anesthesia Post Note  Patient: Angela Collier  Procedure(s) Performed: RIGHT TOTAL KNEE ARTHROPLASTY (Right Knee)     Patient location during evaluation: PACU Anesthesia Type: MAC and Spinal Level of consciousness: awake and alert Pain management: pain level controlled Vital Signs Assessment: post-procedure vital signs reviewed and stable Respiratory status: spontaneous breathing and respiratory function stable Cardiovascular status: blood pressure returned to baseline and stable Postop Assessment: spinal receding Anesthetic complications: no    Last Vitals:  Vitals:   01/10/18 1530 01/10/18 1533  BP: 109/62 109/62  Pulse: (!) 59 (!) 59  Resp: 16 16  Temp: 36.4 C 36.4 C  SpO2: 97% 97%    Last Pain:  Vitals:   01/10/18 1533  TempSrc: Oral  PainSc:                  Tiajuana Amass

## 2018-01-10 NOTE — Plan of Care (Signed)
Plan of care 

## 2018-01-10 NOTE — Op Note (Signed)
OPERATIVE REPORT-TOTAL KNEE ARTHROPLASTY   Pre-operative diagnosis- Osteoarthritis  Right knee(s)  Post-operative diagnosis- Osteoarthritis Right knee(s)  Procedure-  Right  Total Knee Arthroplasty  Surgeon- Dione Plover. Cashis Rill, MD  Assistant- Theresa Duty, PA-C   Anesthesia-  Adductor canal block and spinal  EBL-50 mL   Drains Hemovac  Tourniquet time-  Total Tourniquet Time Documented: Thigh (Right) - 34 minutes Total: Thigh (Right) - 34 minutes     Complications- None  Condition-PACU - hemodynamically stable.   Brief Clinical Note  Angela Collier is a 72 y.o. year old female with end stage OA of her right knee with progressively worsening pain and dysfunction. She has constant pain, with activity and at rest and significant functional deficits with difficulties even with ADLs. She has had extensive non-op management including analgesics, injections of cortisone and viscosupplements, and home exercise program, but remains in significant pain with significant dysfunction.Radiographs show bone on bone arthritis patellofemoral with erosion of patella and trochlea. She presents now for right Total Knee Arthroplasty.    Procedure in detail---   The patient is brought into the operating room and positioned supine on the operating table. After successful administration of Adductor canal block and spinal,   a tourniquet is placed high on the  Right thigh(s) and the lower extremity is prepped and draped in the usual sterile fashion. Time out is performed by the operating team and then the  Right lower extremity is wrapped in Esmarch, knee flexed and the tourniquet inflated to 300 mmHg.       A midline incision is made with a ten blade through the subcutaneous tissue to the level of the extensor mechanism. A fresh blade is used to make a medial parapatellar arthrotomy. Soft tissue over the proximal medial tibia is subperiosteally elevated to the joint line with a knife and into the  semimembranosus bursa with a Cobb elevator. Soft tissue over the proximal lateral tibia is elevated with attention being paid to avoiding the patellar tendon on the tibial tubercle. The patella is everted, knee flexed 90 degrees and the ACL and PCL are removed. Findings are bone on bone patellofemoral with severe erosion of the patella and trochlea with exposed bone medial also.        The drill is used to create a starting hole in the distal femur and the canal is thoroughly irrigated with sterile saline to remove the fatty contents. The 5 degree Right  valgus alignment guide is placed into the femoral canal and the distal femoral cutting block is pinned to remove 9 mm off the distal femur. Resection is made with an oscillating saw.      The tibia is subluxed forward and the menisci are removed. The extramedullary alignment guide is placed referencing proximally at the medial aspect of the tibial tubercle and distally along the second metatarsal axis and tibial crest. The block is pinned to remove 14mm off the more deficient medial  side. Resection is made with an oscillating saw. Size 5is the most appropriate size for the tibia and the proximal tibia is prepared with the modular drill and keel punch for that size.      The femoral sizing guide is placed and size 5 is most appropriate. Rotation is marked off the epicondylar axis and confirmed by creating a rectangular flexion gap at 90 degrees. The size 5 cutting block is pinned in this rotation and the anterior, posterior and chamfer cuts are made with the oscillating saw. The intercondylar block is  then placed and that cut is made.      Trial size 5 tibial component, trial size 5 posterior stabilized femur and a 8  mm posterior stabilized rotating platform insert trial is placed. Full extension is achieved with excellent varus/valgus and anterior/posterior balance throughout full range of motion. The patella is everted and thickness measured to be 18  mm. Free  hand resection is taken to 11 mm, a 35 template is placed, lug holes are drilled, trial patella is placed, and it tracks normally. Osteophytes are removed off the posterior femur with the trial in place. All trials are removed and the cut bone surfaces prepared with pulsatile lavage. Cement is mixed and once ready for implantation, the size 5 tibial implant, size  5 posterior stabilized femoral component, and the size 35 patella are cemented in place and the patella is held with the clamp. The trial insert is placed and the knee held in full extension. The Exparel (20 ml mixed with 60 ml saline) is injected into the extensor mechanism, posterior capsule, medial and lateral gutters and subcutaneous tissues.  All extruded cement is removed and once the cement is hard the permanent 8 mm posterior stabilized rotating platform insert is placed into the tibial tray.      The wound is copiously irrigated with saline solution and the extensor mechanism closed over a hemovac drain with #1 V-loc suture. The tourniquet is released for a total tourniquet time of 34  minutes. Flexion against gravity is 140 degrees and the patella tracks normally. Subcutaneous tissue is closed with 2.0 vicryl and subcuticular with running 4.0 Monocryl. The incision is cleaned and dried and steri-strips and a bulky sterile dressing are applied. The limb is placed into a knee immobilizer and the patient is awakened and transported to recovery in stable condition.      Please note that a surgical assistant was a medical necessity for this procedure in order to perform it in a safe and expeditious manner. Surgical assistant was necessary to retract the ligaments and vital neurovascular structures to prevent injury to them and also necessary for proper positioning of the limb to allow for anatomic placement of the prosthesis.   Dione Plover Samanthan Dugo, MD    01/10/2018, 12:41 PM

## 2018-01-10 NOTE — Interval H&P Note (Signed)
History and Physical Interval Note:  01/10/2018 9:30 AM  Angela Collier  has presented today for surgery, with the diagnosis of right knee osteoarthritis  The various methods of treatment have been discussed with the patient and family. After consideration of risks, benefits and other options for treatment, the patient has consented to  Procedure(s): RIGHT TOTAL KNEE ARTHROPLASTY (Right) as a surgical intervention .  The patient's history has been reviewed, patient examined, no change in status, stable for surgery.  I have reviewed the patient's chart and labs.  Questions were answered to the patient's satisfaction.     Pilar Plate Naheim Burgen

## 2018-01-10 NOTE — Anesthesia Preprocedure Evaluation (Signed)
Anesthesia Evaluation  Patient identified by MRN, date of birth, ID band Patient awake    Reviewed: Allergy & Precautions, NPO status , Patient's Chart, lab work & pertinent test results  Airway Mallampati: II  TM Distance: >3 FB Neck ROM: Full    Dental  (+) Dental Advisory Given   Pulmonary neg pulmonary ROS,    breath sounds clear to auscultation       Cardiovascular negative cardio ROS   Rhythm:Regular Rate:Normal     Neuro/Psych negative neurological ROS     GI/Hepatic negative GI ROS, Neg liver ROS,   Endo/Other  negative endocrine ROS  Renal/GU negative Renal ROS     Musculoskeletal  (+) Arthritis ,   Abdominal   Peds  Hematology negative hematology ROS (+)   Anesthesia Other Findings   Reproductive/Obstetrics                             Lab Results  Component Value Date   WBC 5.3 01/05/2018   HGB 15.2 (H) 01/05/2018   HCT 44.7 01/05/2018   MCV 89.9 01/05/2018   PLT 158 01/05/2018   Lab Results  Component Value Date   CREATININE 0.86 01/05/2018   BUN 15 01/05/2018   NA 144 01/05/2018   K 4.9 01/05/2018   CL 107 01/05/2018   CO2 26 01/05/2018   Lab Results  Component Value Date   INR 0.88 01/05/2018     Anesthesia Physical Anesthesia Plan  ASA: II  Anesthesia Plan: MAC and Spinal   Post-op Pain Management:  Regional for Post-op pain   Induction: Intravenous  PONV Risk Score and Plan: 2 and Ondansetron, Treatment may vary due to age or medical condition and Propofol infusion  Airway Management Planned: Natural Airway and Simple Face Mask  Additional Equipment:   Intra-op Plan:   Post-operative Plan:   Informed Consent: I have reviewed the patients History and Physical, chart, labs and discussed the procedure including the risks, benefits and alternatives for the proposed anesthesia with the patient or authorized representative who has indicated  his/her understanding and acceptance.     Plan Discussed with: CRNA  Anesthesia Plan Comments:         Anesthesia Quick Evaluation

## 2018-01-10 NOTE — Anesthesia Procedure Notes (Signed)
Anesthesia Regional Block: Adductor canal block   Pre-Anesthetic Checklist: ,, timeout performed, Correct Patient, Correct Site, Correct Laterality, Correct Procedure, Correct Position, site marked, Risks and benefits discussed,  Surgical consent,  Pre-op evaluation,  At surgeon's request and post-op pain management  Laterality: Right  Prep: chloraprep       Needles:  Injection technique: Single-shot  Needle Type: Echogenic Needle     Needle Length: 9cm  Needle Gauge: 21     Additional Needles:   Procedures:,,,, ultrasound used (permanent image in chart),,,,  Narrative:  Start time: 01/10/2018 10:58 AM End time: 01/10/2018 11:04 AM Injection made incrementally with aspirations every 5 mL.  Performed by: Personally  Anesthesiologist: Suzette Battiest, MD

## 2018-01-10 NOTE — Anesthesia Procedure Notes (Addendum)
Spinal  Patient location during procedure: OR Start time: 01/10/2018 11:34 AM End time: 01/10/2018 11:38 AM Staffing Anesthesiologist: Suzette Battiest, MD Performed: anesthesiologist  Preanesthetic Checklist Completed: patient identified, site marked, surgical consent, pre-op evaluation, timeout performed, IV checked, risks and benefits discussed and monitors and equipment checked Spinal Block Patient position: sitting Prep: ChloraPrep and site prepped and draped Patient monitoring: blood pressure, continuous pulse ox and heart rate Approach: midline Location: L4-5 Injection technique: single-shot Needle Needle type: Pencan  Needle gauge: 24 G Needle length: 9 cm

## 2018-01-10 NOTE — Discharge Instructions (Addendum)
° °Dr. Frank Aluisio °Total Joint Specialist °[facility] °[address] °[place], [place] [address] °[phone] ° °TOTAL KNEE REPLACEMENT POSTOPERATIVE DIRECTIONS ° °Knee Rehabilitation, Guidelines Following Surgery  °Results after knee surgery are often greatly improved when you follow the exercise, range of motion and muscle strengthening exercises prescribed by your doctor. Safety measures are also important to protect the knee from further injury. Any time any of these exercises cause you to have increased pain or swelling in your knee joint, decrease the amount until you are comfortable again and slowly increase them. If you have problems or questions, call your caregiver or physical therapist for advice.  ° °HOME CARE INSTRUCTIONS  °Remove items at home which could result in a fall. This includes throw rugs or furniture in walking pathways.  °· ICE to the affected knee every three hours for 30 minutes at a time and then as needed for pain and swelling.  Continue to use ice on the knee for pain and swelling from surgery. You may notice swelling that will progress down to the foot and ankle.  This is normal after surgery.  Elevate the leg when you are not up walking on it.   °· Continue to use the breathing machine which will help keep your temperature down.  It is common for your temperature to cycle up and down following surgery, especially at night when you are not up moving around and exerting yourself.  The breathing machine keeps your lungs expanded and your temperature down. °· Do not place pillow under knee, focus on keeping the knee straight while resting ° °DIET °You may resume your previous home diet once your are discharged from the hospital. ° °DRESSING / WOUND CARE / SHOWERING °You may shower 3 days after surgery, but keep the wounds dry during showering.  You may use an occlusive plastic wrap (Press'n Seal for example), NO SOAKING/SUBMERGING IN THE BATHTUB.  If the bandage gets  wet, change with a clean dry gauze.  If the incision gets wet, pat the wound dry with a clean towel. °You may start showering once you are discharged home but do not submerge the incision under water. Just pat the incision dry and apply a dry gauze dressing on daily. °Change the surgical dressing daily and reapply a dry dressing each time. ° °ACTIVITY °Walk with your walker as instructed. °Use walker as long as suggested by your caregivers. °Avoid periods of inactivity such as sitting longer than an hour when not asleep. This helps prevent blood clots.  °You may resume a sexual relationship in one month or when given the OK by your doctor.  °You may return to work once you are cleared by your doctor.  °Do not drive a car for 6 weeks or until released by you surgeon.  °Do not drive while taking narcotics. ° °WEIGHT BEARING °Weight bearing as tolerated with assist device (walker, cane, etc) as directed, use it as long as suggested by your surgeon or therapist, typically at least 4-6 weeks. ° °POSTOPERATIVE CONSTIPATION PROTOCOL °Constipation - defined medically as fewer than three stools per week and severe constipation as less than one stool per week. ° °One of the most common issues patients have following surgery is constipation.  Even if you have a regular bowel pattern at home, your normal regimen is likely to be disrupted due to multiple reasons following surgery.  Combination of anesthesia, postoperative narcotics, change in appetite and fluid intake all can affect your bowels.    In order to avoid complications following surgery, here are some recommendations in order to help you during your recovery period. ° °Colace (docusate) - Pick up an over-the-counter form of Colace or another stool softener and take twice a day as long as you are requiring postoperative pain medications.  Take with a full glass of water daily.  If you experience loose stools or diarrhea, hold the colace until you stool forms back up.  If  your symptoms do not get better within 1 week or if they get worse, check with your doctor. ° °Dulcolax (bisacodyl) - Pick up over-the-counter and take as directed by the product packaging as needed to assist with the movement of your bowels.  Take with a full glass of water.  Use this product as needed if not relieved by Colace only.  ° °MiraLax (polyethylene glycol) - Pick up over-the-counter to have on hand.  MiraLax is a solution that will increase the amount of water in your bowels to assist with bowel movements.  Take as directed and can mix with a glass of water, juice, soda, coffee, or tea.  Take if you go more than two days without a movement. °Do not use MiraLax more than once per day. Call your doctor if you are still constipated or irregular after using this medication for 7 days in a row. ° °If you continue to have problems with postoperative constipation, please contact the office for further assistance and recommendations.  If you experience "the worst abdominal pain ever" or develop nausea or vomiting, please contact the office immediatly for further recommendations for treatment. ° °ITCHING ° If you experience itching with your medications, try taking only a single pain pill, or even half a pain pill at a time.  You can also use Benadryl over the counter for itching or also to help with sleep.  ° °TED HOSE STOCKINGS °Wear the elastic stockings on both legs for three weeks following surgery during the day but you may remove then at night for sleeping. ° °MEDICATIONS °See your medication summary on the “After Visit Summary” that the nursing staff will review with you prior to discharge.  You may have some home medications which will be placed on hold until you complete the course of blood thinner medication.  It is important for you to complete the blood thinner medication as prescribed by your surgeon.  Continue your approved medications as instructed at time of discharge. ° °PRECAUTIONS °If you  experience chest pain or shortness of breath - call 911 immediately for transfer to the hospital emergency department.  °If you develop a fever greater that 101 F, purulent drainage from wound, increased redness or drainage from wound, foul odor from the wound/dressing, or calf pain - CONTACT YOUR SURGEON.   °                                                °FOLLOW-UP APPOINTMENTS °Make sure you keep all of your appointments after your operation with your surgeon and caregivers. You should call the office at the above phone number and make an appointment for approximately two weeks after the date of your surgery or on the date instructed by your surgeon outlined in the "After Visit Summary". ° ° °RANGE OF MOTION AND STRENGTHENING EXERCISES  °Rehabilitation of the knee is important following a knee injury or   an operation. After just a few days of immobilization, the muscles of the thigh which control the knee become weakened and shrink (atrophy). Knee exercises are designed to build up the tone and strength of the thigh muscles and to improve knee motion. Often times heat used for twenty to thirty minutes before working out will loosen up your tissues and help with improving the range of motion but do not use heat for the first two weeks following surgery. These exercises can be done on a training (exercise) mat, on the floor, on a table or on a bed. Use what ever works the best and is most comfortable for you Knee exercises include:  °Leg Lifts - While your knee is still immobilized in a splint or cast, you can do straight leg raises. Lift the leg to 60 degrees, hold for 3 sec, and slowly lower the leg. Repeat 10-20 times 2-3 times daily. Perform this exercise against resistance later as your knee gets better.  °Quad and Hamstring Sets - Tighten up the muscle on the front of the thigh (Quad) and hold for 5-10 sec. Repeat this 10-20 times hourly. Hamstring sets are done by pushing the foot backward against an object  and holding for 5-10 sec. Repeat as with quad sets.  °· Leg Slides: Lying on your back, slowly slide your foot toward your buttocks, bending your knee up off the floor (only go as far as is comfortable). Then slowly slide your foot back down until your leg is flat on the floor again. °· Angel Wings: Lying on your back spread your legs to the side as far apart as you can without causing discomfort.  °A rehabilitation program following serious knee injuries can speed recovery and prevent re-injury in the future due to weakened muscles. Contact your doctor or a physical therapist for more information on knee rehabilitation.  ° °IF YOU ARE TRANSFERRED TO A SKILLED REHAB FACILITY °If the patient is transferred to a skilled rehab facility following release from the hospital, a list of the current medications will be sent to the facility for the patient to continue.  When discharged from the skilled rehab facility, please have the facility set up the patient's Home Health Physical Therapy prior to being released. Also, the skilled facility will be responsible for providing the patient with their medications at time of release from the facility to include their pain medication, the muscle relaxants, and their blood thinner medication. If the patient is still at the rehab facility at time of the two week follow up appointment, the skilled rehab facility will also need to assist the patient in arranging follow up appointment in our office and any transportation needs. ° °MAKE SURE YOU:  °Understand these instructions.  °Get help right away if you are not doing well or get worse.  ° ° °Pick up stool softner and laxative for home use following surgery while on pain medications. °Do not submerge incision under water. °Please use good hand washing techniques while changing dressing each day. °May shower starting three days after surgery. °Please use a clean towel to pat the incision dry following showers. °Continue to use ice for  pain and swelling after surgery. °Do not use any lotions or creams on the incision until instructed by your surgeon. ° °Information on my medicine - XARELTO® (Rivaroxaban) ° ° ° °Why was Xarelto® prescribed for you? °Xarelto® was prescribed for you to reduce the risk of blood clots forming after orthopedic surgery. The medical term for   these abnormal blood clots is venous thromboembolism (VTE). ° °What do you need to know about xarelto® ? °Take your Xarelto® ONCE DAILY at the same time every day. °You may take it either with or without food. ° °If you have difficulty swallowing the tablet whole, you may crush it and mix in applesauce just prior to taking your dose. ° °Take Xarelto® exactly as prescribed by your doctor and DO NOT stop taking Xarelto® without talking to the doctor who prescribed the medication.  Stopping without other VTE prevention medication to take the place of Xarelto® may increase your risk of developing a clot. ° °After discharge, you should have regular check-up appointments with your healthcare provider that is prescribing your Xarelto®.   ° °What do you do if you miss a dose? °If you miss a dose, take it as soon as you remember on the same day then continue your regularly scheduled once daily regimen the next day. Do not take two doses of Xarelto® on the same day.  ° °Important Safety Information °A possible side effect of Xarelto® is bleeding. You should call your healthcare provider right away if you experience any of the following: °? Bleeding from an injury or your nose that does not stop. °? Unusual colored urine (red or dark brown) or unusual colored stools (red or black). °? Unusual bruising for unknown reasons. °? A serious fall or if you hit your head (even if there is no bleeding). ° °Some medicines may interact with Xarelto® and might increase your risk of bleeding while on Xarelto®. To help avoid this, consult your healthcare provider or pharmacist prior to using any new  prescription or non-prescription medications, including herbals, vitamins, non-steroidal anti-inflammatory drugs (NSAIDs) and supplements. ° °This website has more information on Xarelto®: www.xarelto.com. ° ° °

## 2018-01-10 NOTE — Transfer of Care (Signed)
Immediate Anesthesia Transfer of Care Note  Patient: Angela Collier  Procedure(s) Performed: RIGHT TOTAL KNEE ARTHROPLASTY (Right Knee)  Patient Location: PACU  Anesthesia Type:Regional and Spinal  Level of Consciousness: awake, alert  and oriented  Airway & Oxygen Therapy: Patient Spontanous Breathing and Patient connected to face mask oxygen  Post-op Assessment: Report given to RN and Post -op Vital signs reviewed and stable  Post vital signs: Reviewed and stable  Last Vitals:  Vitals Value Taken Time  BP    Temp    Pulse 69 01/10/2018  1:09 PM  Resp 8 01/10/2018  1:09 PM  SpO2 97 % 01/10/2018  1:09 PM  Vitals shown include unvalidated device data.  Last Pain:  Vitals:   01/10/18 1110  TempSrc:   PainSc: 0-No pain      Patients Stated Pain Goal: 3 (56/86/16 [id number])  Complications: No apparent anesthesia complications

## 2018-01-11 ENCOUNTER — Encounter (HOSPITAL_COMMUNITY): Payer: Self-pay | Admitting: Orthopedic Surgery

## 2018-01-11 DIAGNOSIS — M170 Bilateral primary osteoarthritis of knee: Secondary | ICD-10-CM | POA: Diagnosis not present

## 2018-01-11 LAB — BASIC METABOLIC PANEL
ANION GAP: 5 (ref 5–15)
BUN: 13 mg/dL (ref 8–23)
CALCIUM: 8.7 mg/dL — AB (ref 8.9–10.3)
CO2: 26 mmol/L (ref 22–32)
Chloride: 112 mmol/L — ABNORMAL HIGH (ref 98–111)
Creatinine, Ser: 0.74 mg/dL (ref 0.44–1.00)
GFR calc Af Amer: >60 mL/min (ref >60)
GFR calc non Af Amer: >60 mL/min (ref >60)
GLUCOSE: 124 mg/dL — AB (ref 70–99)
POTASSIUM: 4.5 mmol/L (ref 3.5–5.1)
Sodium: 143 mmol/L (ref 135–145)

## 2018-01-11 LAB — CBC
HEMATOCRIT: 34.8 % — AB (ref 36.0–46.0)
Hemoglobin: 11.7 g/dL — ABNORMAL LOW (ref 12.0–15.0)
MCH: 30.5 pg (ref 26.0–34.0)
MCHC: 33.6 g/dL (ref 30.0–36.0)
MCV: 90.9 fL (ref 78.0–100.0)
Platelets: 128 10*3/uL — ABNORMAL LOW (ref 150–400)
RBC: 3.83 MIL/uL — AB (ref 3.87–5.11)
RDW: 13 % (ref 11.5–15.5)
WBC: 8.2 10*3/uL (ref 4.0–10.5)

## 2018-01-11 MED ORDER — SODIUM CHLORIDE 0.9 % IV BOLUS
250.0000 mL | Freq: Once | INTRAVENOUS | Status: AC
  Administered 2018-01-11: 250 mL via INTRAVENOUS

## 2018-01-11 MED ORDER — SODIUM CHLORIDE 0.9 % IV BOLUS: 250.0000 mL | Freq: Once | INTRAVENOUS | Status: DC

## 2018-01-11 NOTE — Care Management Note (Signed)
Case Management Note  Patient Details  Name: Angela Collier MRN: [id number] Date of Birth: 1945-12-27  Subjective/Objective:      Discharge planning, spoke with patient and spouse at bedside. Have chosen [facility], evaluate and treat.   Action/Plan: Contacted [facility] for referral. Has DME. [phone]              Expected Discharge Date:                  Expected Discharge Plan:  [place]  In-House Referral:  NA  Discharge planning Services  CM Consult  Post Acute Care Choice:  Home Health Choice offered to:  Patient, Spouse  DME Arranged:  N/A DME Agency:  NA  HH Arranged:  PT HH Agency:  [facility] (formerly [employer])  Status of Service:  Completed, signed off  If discussed at [employer], dates discussed:    Additional Comments:  Guadalupe Maple, RN 01/11/2018, 11:05 AM

## 2018-01-11 NOTE — Progress Notes (Signed)
   Subjective: 1 Day Post-Op Procedure(s) (LRB): RIGHT TOTAL KNEE ARTHROPLASTY (Right) Patient reports pain as mild.   Patient seen in rounds by Dr. Wynelle Link. Patient is well, and has had no acute complaints or problems other than pain in the right knee. No issues overnight. Denies chest pain, SOB or calf pain. Foley catheter removed this AM.  We will start therapy today.   Objective: Vital signs in last 24 hours: Temp:  [96.3 F (35.7 C)-98.2 F (36.8 C)] 97.7 F (36.5 C) (10/01 0548) Pulse Rate:  [55-70] 58 (10/01 0548) Resp:  [7-22] 16 (10/01 0548) BP: (100-146)/(55-86) 100/55 (10/01 0548) SpO2:  [95 %-100 %] 99 % (10/01 0548) Weight:  [64 kg-64.9 kg] 64 kg (09/30 1530)  Intake/Output from previous day:  Intake/Output Summary (Last 24 hours) at 01/11/2018 0720 Last data filed at 01/11/2018 0600 Gross per 24 hour  Intake 2892.17 ml  Output 2590 ml  Net 302.17 ml    Labs: Recent Labs    01/11/18 0525  HGB 11.7*   Recent Labs    01/11/18 0525  WBC 8.2  RBC 3.83*  HCT 34.8*  PLT 128*   Recent Labs    01/11/18 0525  NA 143  K 4.5  CL 112*  CO2 26  BUN 13  CREATININE 0.74  GLUCOSE 124*  CALCIUM 8.7*   Exam: General - Patient is Alert and Oriented Extremity - Neurologically intact Neurovascular intact Sensation intact distally Dorsiflexion/Plantar flexion intact Dressing - dressing C/D/I Motor Function - intact, moving foot and toes well on exam.   Past Medical History:  Diagnosis Date  . Arthritis    large joint   . Hyperlipidemia   . Macular degeneration   . Osteopenia   . Osteoporosis   . Pneumonia    childhood age 83   . Varicose veins     Assessment/Plan: 1 Day Post-Op Procedure(s) (LRB): RIGHT TOTAL KNEE ARTHROPLASTY (Right) Principal Problem:   Osteoarthritis of both knees Active Problems:   OA (osteoarthritis) of knee  Estimated body mass index is 24.99 kg/m as calculated from the following:   Height as of this encounter: 5\' 3"   (1.6 m).   Weight as of this encounter: 64 kg. Advance diet Up with therapy  Anticipated LOS equal to or greater than 2 midnights due to - Age 71 and older with one or more of the following:  - Obesity  - Expected need for hospital services (PT, OT, Nursing) required for safe  discharge  - Anticipated need for postoperative skilled nursing care or inpatient rehab  - Active co-morbidities: None OR   - Unanticipated findings during/Post Surgery: None  - Patient is a high risk of re-admission due to: None    DVT Prophylaxis - Xarelto Weight bearing as tolerated. D/C O2 and pulse ox and try on room air. Hemovac pulled without difficulty, will begin therapy today.  BP soft this AM at 100/55 with HR 58. 250 mL bolus ordered.  Plan is to go Home after hospital stay. Plan for discharge tomorrow if progresses with therapy and is meeting her goals.  Theresa Duty, PA-C Orthopedic Surgery 01/11/2018, 7:20 AM

## 2018-01-11 NOTE — Evaluation (Signed)
Physical Therapy Evaluation Patient Details Name: Angela Collier MRN: [id number] DOB: 22-Aug-1945 Today's Date: 01/11/2018   History of Present Illness  s/p R TKA  Clinical Impression  Pt is s/p TKA resulting in the deficits listed below (see PT Problem List).  Pt doing well, amb 100' with RW and min/guard assist, asymptomatic of decr BP Pt will benefit from skilled PT to increase their independence and safety with mobility to allow discharge to the venue listed below.      Follow Up Recommendations Follow surgeon's recommendation for DC plan and follow-up therapies    Equipment Recommendations  None recommended by PT    Recommendations for Other Services       Precautions / Restrictions Precautions Precautions: Knee Required Braces or Orthoses: Knee Immobilizer - Right Knee Immobilizer - Right: Discontinue once straight leg raise with < 10 degree lag Restrictions Weight Bearing Restrictions: No Other Position/Activity Restrictions: WBAT      Mobility  Bed Mobility Overal bed mobility: Needs Assistance Bed Mobility: Supine to Sit     Supine to sit: Min guard     General bed mobility comments: with RLE  Transfers Overall transfer level: Needs assistance Equipment used: Rolling walker (2 wheeled) Transfers: Sit to/from Stand Sit to Stand: Min guard;Min assist         General transfer comment: light assist to rise and transtion to RW, cues for hand placement  Ambulation/Gait Ambulation/Gait assistance: Min guard Gait Distance (Feet): 100 Feet Assistive device: Rolling walker (2 wheeled) Gait Pattern/deviations: Step-to pattern;Step-through pattern;Decreased stride length     General Gait Details: cues for sequence and RW position  Stairs            Wheelchair Mobility    Modified Rankin (Stroke Patients Only)       Balance                                             Pertinent Vitals/Pain Pain Assessment: 0-10 Pain  Location: right knee Pain Descriptors / Indicators: Sore Pain Intervention(s): Monitored during session;Limited activity within patient's tolerance;Repositioned;Ice applied    Home Living Family/patient expects to be discharged to:: Private residence Living Arrangements: Spouse/significant other Available Help at Discharge: Family Type of Home: House Home Access: Stairs to enter Entrance Stairs-Rails: Right Entrance Stairs-Number of Steps: 5 Home Layout: Multi-level;1/2 bath on main level Home Equipment: Bedside commode;Walker - 2 wheels      Prior Function Level of Independence: Independent               Hand Dominance        Extremity/Trunk Assessment   Upper Extremity Assessment Upper Extremity Assessment: Overall WFL for tasks assessed    Lower Extremity Assessment Lower Extremity Assessment: RLE deficits/detail RLE Deficits / Details: ankle WFL; knee extension 2/5, limited by post op weakness; knee flexion -5 to 60* AAROM       Communication   Communication: No difficulties  Cognition Arousal/Alertness: Awake/alert Behavior During Therapy: WFL for tasks assessed/performed Overall Cognitive Status: Within Functional Limits for tasks assessed                                        General Comments      Exercises Total Joint Exercises Ankle Circles/Pumps: AROM;Both;20 reps Quad Sets: AROM;Both;15 reps  Heel Slides: AAROM;AROM;15 reps;Right Straight Leg Raises: AAROM;5 reps;Right   Assessment/Plan    PT Assessment Patient needs continued PT services  PT Problem List Decreased strength;Decreased range of motion;Decreased activity tolerance;Decreased mobility;Decreased knowledge of use of DME;Pain;Decreased knowledge of precautions       PT Treatment Interventions DME instruction;Gait training;Functional mobility training;Therapeutic exercise;Patient/family education;Stair training    PT Goals (Current goals can be found in the Care  Plan section)  Acute Rehab PT Goals Time For Goal Achievement: 01/14/18 Potential to Achieve Goals: Good    Frequency 7X/week   Barriers to discharge        Co-evaluation               AM-PAC PT "6 Clicks" Daily Activity  Outcome Measure Difficulty turning over in bed (including adjusting bedclothes, sheets and blankets)?: A Little Difficulty moving from lying on back to sitting on the side of the bed? : Unable Difficulty sitting down on and standing up from a chair with arms (e.g., wheelchair, bedside commode, etc,.)?: Unable Help needed moving to and from a bed to chair (including a wheelchair)?: A Little Help needed walking in hospital room?: A Little Help needed climbing 3-5 steps with a railing? : A Little 6 Click Score: 14    End of Session Equipment Utilized During Treatment: Gait belt;Right knee immobilizer Activity Tolerance: Patient tolerated treatment well Patient left: with call bell/phone within reach;in chair;with family/visitor present;with chair alarm set   PT Visit Diagnosis: Difficulty in walking, not elsewhere classified (R26.2)    Time: 3149-7026 PT Time Calculation (min) (ACUTE ONLY): 22 min   Charges:   PT Evaluation $PT Eval Low Complexity: 1 Low          Kenyon Ana, PT Pager: [phone] 01/11/2018   [facility] [phone]   [facility] 01/11/2018, 11:43 AM

## 2018-01-11 NOTE — Progress Notes (Signed)
Physical Therapy Treatment Patient Details Name: Angela Collier MRN: [id number] DOB: 1945-08-11 Today's Date: 01/11/2018    History of Present Illness s/p R TKA    PT Comments    Pt progressing toward PT goals; discussed not  Over doing it during early stages of recovery, and advised ok  to continue ankle pumps and quad sets but not to perform complete   HEP any more today; will continue HEP, stairs and gait training tomorrow;  Follow Up Recommendations  Follow surgeon's recommendation for DC plan and follow-up therapies     Equipment Recommendations  None recommended by PT    Recommendations for Other Services       Precautions / Restrictions Precautions Precautions: Knee Required Braces or Orthoses: Knee Immobilizer - Right Knee Immobilizer - Right: Discontinue once straight leg raise with < 10 degree lag Restrictions Weight Bearing Restrictions: No Other Position/Activity Restrictions: WBAT    Mobility  Bed Mobility Overal bed mobility: Needs Assistance Bed Mobility: Sit to Supine     Supine to sit: Min guard Sit to supine: Min assist   General bed mobility comments: with RLE  Transfers Overall transfer level: Needs assistance Equipment used: Rolling walker (2 wheeled) Transfers: Sit to/from Stand Sit to Stand: Min guard         General transfer comment: light assist to rise and transtion to RW, cues for hand placement  Ambulation/Gait Ambulation/Gait assistance: Min guard Gait Distance (Feet): 130 Feet Assistive device: Rolling walker (2 wheeled) Gait Pattern/deviations: Step-to pattern;Step-through pattern;Decreased stride length     General Gait Details: cues for sequence and RW position   Stairs             Wheelchair Mobility    Modified Rankin (Stroke Patients Only)       Balance                                            Cognition Arousal/Alertness: Awake/alert Behavior During Therapy: WFL for tasks  assessed/performed Overall Cognitive Status: Within Functional Limits for tasks assessed                                        Exercises Total Joint Exercises Ankle Circles/Pumps: AROM;Both;20 reps Quad Sets: AROM;Both;15 reps Heel Slides: AAROM;AROM;15 reps;Right Straight Leg Raises: AAROM;Right;10 reps    General Comments        Pertinent Vitals/Pain Pain Assessment: 0-10 Pain Score: 4  Pain Location: right knee Pain Descriptors / Indicators: Sore Pain Intervention(s): Limited activity within patient's tolerance;Monitored during session;Premedicated before session;Repositioned;Ice applied    Home Living Family/patient expects to be discharged to:: Private residence Living Arrangements: Spouse/significant other Available Help at Discharge: Family Type of Home: House Home Access: Stairs to enter Entrance Stairs-Rails: Right Home Layout: Multi-level;1/2 bath on main level Home Equipment: Bedside commode;Walker - 2 wheels      Prior Function Level of Independence: Independent          PT Goals (current goals can now be found in the care plan section) Acute Rehab PT Goals Time For Goal Achievement: 01/14/18 Potential to Achieve Goals: Good Progress towards PT goals: Progressing toward goals    Frequency    7X/week      PT Plan Current plan remains appropriate    Co-evaluation  AM-PAC PT "6 Clicks" Daily Activity  Outcome Measure  Difficulty turning over in bed (including adjusting bedclothes, sheets and blankets)?: A Little Difficulty moving from lying on back to sitting on the side of the bed? : Unable Difficulty sitting down on and standing up from a chair with arms (e.g., wheelchair, bedside commode, etc,.)?: Unable Help needed moving to and from a bed to chair (including a wheelchair)?: A Little Help needed walking in hospital room?: A Little Help needed climbing 3-5 steps with a railing? : A Little 6 Click Score:  14    End of Session Equipment Utilized During Treatment: Gait belt;Right knee immobilizer Activity Tolerance: Patient tolerated treatment well Patient left: in bed;with call bell/phone within reach;with nursing/sitter in room;with family/visitor present   PT Visit Diagnosis: Difficulty in walking, not elsewhere classified (R26.2)     Time: 0865-7846 PT Time Calculation (min) (ACUTE ONLY): 37 min  Charges:  $Gait Training: 8-22 mins $Therapeutic Exercise: 8-22 mins                     Kenyon Ana, PT Pager: [phone] 01/11/2018   [facility] [phone]    [facility] 01/11/2018, 3:00 PM

## 2018-01-12 DIAGNOSIS — M1711 Unilateral primary osteoarthritis, right knee: Secondary | ICD-10-CM | POA: Diagnosis present

## 2018-01-12 DIAGNOSIS — M170 Bilateral primary osteoarthritis of knee: Secondary | ICD-10-CM | POA: Diagnosis not present

## 2018-01-12 LAB — BASIC METABOLIC PANEL
ANION GAP: 8 (ref 5–15)
BUN: 17 mg/dL (ref 8–23)
CO2: 27 mmol/L (ref 22–32)
Calcium: 8.7 mg/dL — ABNORMAL LOW (ref 8.9–10.3)
Chloride: 108 mmol/L (ref 98–111)
Creatinine, Ser: 0.7 mg/dL (ref 0.44–1.00)
GFR calc Af Amer: >60 mL/min (ref >60)
GFR calc non Af Amer: >60 mL/min (ref >60)
GLUCOSE: 112 mg/dL — AB (ref 70–99)
POTASSIUM: 4.2 mmol/L (ref 3.5–5.1)
Sodium: 143 mmol/L (ref 135–145)

## 2018-01-12 LAB — CBC
HEMATOCRIT: 32.7 % — AB (ref 36.0–46.0)
Hemoglobin: 11 g/dL — ABNORMAL LOW (ref 12.0–15.0)
MCH: 30.6 pg (ref 26.0–34.0)
MCHC: 33.6 g/dL (ref 30.0–36.0)
MCV: 91.1 fL (ref 78.0–100.0)
Platelets: 130 10*3/uL — ABNORMAL LOW (ref 150–400)
RBC: 3.59 MIL/uL — ABNORMAL LOW (ref 3.87–5.11)
RDW: 13.2 % (ref 11.5–15.5)
WBC: 8.2 10*3/uL (ref 4.0–10.5)

## 2018-01-12 MED ORDER — RIVAROXABAN 10 MG PO TABS: 10.0000 mg | ORAL_TABLET | Freq: Every day | ORAL | 0 refills | Status: DC

## 2018-01-12 MED ORDER — OXYCODONE HCL 5 MG PO TABS: 5.0000 mg | ORAL_TABLET | Freq: Four times a day (QID) | ORAL | 0 refills | Status: DC | PRN

## 2018-01-12 MED ORDER — TRAMADOL HCL 50 MG PO TABS: 50.0000 mg | ORAL_TABLET | Freq: Four times a day (QID) | ORAL | 0 refills | Status: DC | PRN

## 2018-01-12 MED ORDER — METHOCARBAMOL 500 MG PO TABS: 500.0000 mg | ORAL_TABLET | Freq: Four times a day (QID) | ORAL | 0 refills | Status: DC | PRN

## 2018-01-12 NOTE — Progress Notes (Signed)
   Subjective: 2 Days Post-Op Procedure(s) (LRB): RIGHT TOTAL KNEE ARTHROPLASTY (Right) Patient reports pain as mild.   Patient seen in rounds with Dr. Wynelle Link. Patient is well, and has had no acute complaints or problems. States she is ready to go home. Denies chest pain, SOB or calf pain. No issues overnight. Voiding without difficulty and positive flatus.  Plan is to go Home after hospital stay.  Objective: Vital signs in last 24 hours: Temp:  [98.2 F (36.8 C)-98.6 F (37 C)] 98.2 F (36.8 C) (10/01 2114) Pulse Rate:  [63-68] 63 (10/01 2114) Resp:  [14-16] 16 (10/01 2114) BP: (99-123)/(56-68) 123/68 (10/01 2114) SpO2:  [97 %-98 %] 98 % (10/01 2114)  Intake/Output from previous day:  Intake/Output Summary (Last 24 hours) at 01/12/2018 0726 Last data filed at 01/12/2018 0200 Gross per 24 hour  Intake 1616.15 ml  Output 1600 ml  Net 16.15 ml    Labs: Recent Labs    01/11/18 0525 01/12/18 0406  HGB 11.7* 11.0*   Recent Labs    01/11/18 0525 01/12/18 0406  WBC 8.2 8.2  RBC 3.83* 3.59*  HCT 34.8* 32.7*  PLT 128* 130*   Recent Labs    01/11/18 0525 01/12/18 0406  NA 143 143  K 4.5 4.2  CL 112* 108  CO2 26 27  BUN 13 17  CREATININE 0.74 0.70  GLUCOSE 124* 112*  CALCIUM 8.7* 8.7*   Exam: General - Patient is Alert and Oriented Extremity - Neurologically intact Neurovascular intact Sensation intact distally Dorsiflexion/Plantar flexion intact Dressing/Incision - clean, dry, no drainage Motor Function - intact, moving foot and toes well on exam.   Past Medical History:  Diagnosis Date  . Arthritis    large joint   . Hyperlipidemia   . Macular degeneration   . Osteopenia   . Osteoporosis   . Pneumonia    childhood age 32   . Varicose veins     Assessment/Plan: 2 Days Post-Op Procedure(s) (LRB): RIGHT TOTAL KNEE ARTHROPLASTY (Right) Principal Problem:   Osteoarthritis of both knees Active Problems:   OA (osteoarthritis) of knee  Estimated  body mass index is 24.99 kg/m as calculated from the following:   Height as of this encounter: 5\' 3"  (1.6 m).   Weight as of this encounter: 64 kg. Up with therapy D/C IV fluids  DVT Prophylaxis - Xarelto Weight-bearing as tolerated  Plan for discharge around lunchtime today after one session of therapy with HHPT followed by outpatient physical therapy at [facility]. Follow-up in the office in 2 weeks with Dr. Wynelle Link.  Theresa Duty, PA-C Orthopedic Surgery 01/12/2018, 7:26 AM

## 2018-01-12 NOTE — Progress Notes (Signed)
Physical Therapy Treatment Patient Details Name: Angela Collier MRN: [id number] DOB: 10/03/1945 Today's Date: 01/12/2018    History of Present Illness s/p R TKA    PT Comments    POD # 2  Family present during session.  Pt c/o increased pain today vs yesterday.  Educated on [place] use for stairs for increased support and proper application.  Had spouse "hands on" assist pt with all mobility/stairs.   Instructed on TE's as well as use of cryotherapy.  Pt ready to D/C to home.   Follow Up Recommendations  Follow surgeon's recommendation for DC plan and follow-up therapies     Equipment Recommendations  None recommended by PT    Recommendations for Other Services       Precautions / Restrictions Precautions Precautions: Knee Precaution Comments: advised to wear KI for stairs due to increased pain today and multiple steps  Required Braces or Orthoses: Knee Immobilizer - Right Knee Immobilizer - Right: Discontinue once straight leg raise with < 10 degree lag Restrictions Weight Bearing Restrictions: No Other Position/Activity Restrictions: WBAT    Mobility  Bed Mobility Overal bed mobility: Needs Assistance Bed Mobility: Supine to Sit     Supine to sit: Min assist     General bed mobility comments: assist R LE instructed family "hands on"  proper safe handling   Transfers Overall transfer level: Needs assistance Equipment used: Rolling walker (2 wheeled) Transfers: Sit to/from Stand Sit to Stand: Min guard;Supervision         General transfer comment: instructed family on safe handling "hands on" education  Ambulation/Gait Ambulation/Gait assistance: Supervision;Min guard Gait Distance (Feet): 55 Feet Assistive device: Rolling walker (2 wheeled) Gait Pattern/deviations: Step-to pattern;Step-through pattern;Decreased stride length Gait velocity: decreased   General Gait Details: cues for sequence and RW position    decreased distance due to increased c/o  pain   Stairs Stairs: Yes Stairs assistance: Min guard Stair Management: Two rails;Forwards;Step to pattern Number of Stairs: 3 General stair comments: with 25% VC's on proper sequencing and safe handling with spouse "hands on" education   Wheelchair Mobility    Modified Rankin (Stroke Patients Only)       Balance                                            Cognition Arousal/Alertness: Awake/alert Behavior During Therapy: WFL for tasks assessed/performed Overall Cognitive Status: Within Functional Limits for tasks assessed                                        Exercises      General Comments        Pertinent Vitals/Pain Pain Assessment: 0-10 Pain Score: 6  Pain Location: right knee Pain Descriptors / Indicators: Sore Pain Intervention(s): Monitored during session;Repositioned;Ice applied    Home Living                      Prior Function            PT Goals (current goals can now be found in the care plan section) Progress towards PT goals: Progressing toward goals    Frequency    7X/week      PT Plan Current plan remains appropriate    Co-evaluation  AM-PAC PT "6 Clicks" Daily Activity  Outcome Measure  Difficulty turning over in bed (including adjusting bedclothes, sheets and blankets)?: A Little Difficulty moving from lying on back to sitting on the side of the bed? : A Little Difficulty sitting down on and standing up from a chair with arms (e.g., wheelchair, bedside commode, etc,.)?: A Little Help needed moving to and from a bed to chair (including a wheelchair)?: A Little Help needed walking in hospital room?: A Little Help needed climbing 3-5 steps with a railing? : A Little 6 Click Score: 18    End of Session Equipment Utilized During Treatment: Gait belt Activity Tolerance: Patient tolerated treatment well Patient left: in chair;with call bell/phone within reach Nurse  Communication: (pt ready for D/C to home) PT Visit Diagnosis: Difficulty in walking, not elsewhere classified (R26.2)     Time: 1120-1200 PT Time Calculation (min) (ACUTE ONLY): 40 min  Charges:  $Gait Training: 8-22 mins $Therapeutic Exercise: 8-22 mins $Self Care/Home Management: 8-22                     Rica Koyanagi  PTA [place] Pager      [phone] Office      [phone]

## 2018-01-12 NOTE — Plan of Care (Signed)
Pt alert and oriented, doing well. Pain well controlled with PO pain meds.  Plan to d/c home per Md order. RN will monitor.

## 2018-01-12 NOTE — Care Management Obs Status (Signed)
[place] NOTIFICATION   Patient Details  Name: Angela Collier MRN: [id number] Date of Birth: 02-23-1946   Medicare Observation Status Notification Given:  Yes    Guadalupe Maple, RN 01/12/2018, 12:07 PM

## 2018-01-12 NOTE — Care Management Obs Status (Deleted)
[place] NOTIFICATION   Patient Details  Name: Angela Collier MRN: [id number] Date of Birth: 03-02-1946   Medicare Observation Status Notification Given:  Yes    Guadalupe Maple, RN 01/12/2018, 12:04 PM

## 2018-01-12 NOTE — Care Management CC44 (Signed)
Condition Code 44 Documentation Completed  Patient Details  Name: Connie Lasater MRN: [id number] Date of Birth: 1945-12-06   Condition Code 44 given:  Yes Patient signature on Condition Code 44 notice:  Yes Documentation of 2 MD's agreement:  Yes Code 44 added to claim:  Yes    Guadalupe Maple, RN 01/12/2018, 12:07 PM

## 2018-01-17 NOTE — Discharge Summary (Signed)
Physician Discharge Summary   Patient ID: Angela Collier MRN: [id number] DOB/AGE: 05-03-1945 72 y.o.  Admit date: 01/10/2018 Discharge date: 01/12/2018  Primary Diagnosis: Osteoarthritis, right knee   Admission Diagnoses:  Past Medical History:  Diagnosis Date  . Arthritis    large joint   . Hyperlipidemia   . Macular degeneration   . Osteopenia   . Osteoporosis   . Pneumonia    childhood age 72   . Varicose veins    Discharge Diagnoses:   Principal Problem:   Osteoarthritis of both knees Active Problems:   OA (osteoarthritis) of knee   Osteoarthritis of right knee  Estimated body mass index is 24.99 kg/m as calculated from the following:   Height as of this encounter: 5' 3" (1.6 m).   Weight as of this encounter: 64 kg.  Procedure:  Procedure(s) (LRB): RIGHT TOTAL KNEE ARTHROPLASTY (Right)   Consults: None  HPI: Angela Collier is a 72 y.o. year old female with end stage OA of her right knee with progressively worsening pain and dysfunction. She has constant pain, with activity and at rest and significant functional deficits with difficulties even with ADLs. She has had extensive non-op management including analgesics, injections of cortisone and viscosupplements, and home exercise program, but remains in significant pain with significant dysfunction.Radiographs show bone on bone arthritis patellofemoral with erosion of patella and trochlea. She presents now for right Total Knee Arthroplasty.   Laboratory Data: Admission on 01/10/2018, Discharged on 01/12/2018  Component Date Value Ref Range Status  . WBC 01/11/2018 8.2  4.0 - 10.5 K/uL Final  . RBC 01/11/2018 3.83* 3.87 - 5.11 MIL/uL Final  . Hemoglobin 01/11/2018 11.7* 12.0 - 15.0 g/dL Final  . HCT 01/11/2018 34.8* 36.0 - 46.0 % Final  . MCV 01/11/2018 90.9  78.0 - 100.0 fL Final  . MCH 01/11/2018 30.5  26.0 - 34.0 pg Final  . MCHC 01/11/2018 33.6  30.0 - 36.0 g/dL Final  . RDW 01/11/2018 13.0  11.5 - 15.5 %  Final  . Platelets 01/11/2018 128* 150 - 400 K/uL Final   Performed at [facility], [place] [address], [place], [place] [address]  . Sodium 01/11/2018 143  135 - 145 mmol/L Final  . Potassium 01/11/2018 4.5  3.5 - 5.1 mmol/L Final  . Chloride 01/11/2018 112* 98 - 111 mmol/L Final  . CO2 01/11/2018 26  22 - 32 mmol/L Final  . Glucose, Bld 01/11/2018 124* 70 - 99 mg/dL Final  . BUN 01/11/2018 13  8 - 23 mg/dL Final  . Creatinine, Ser 01/11/2018 0.74  0.44 - 1.00 mg/dL Final  . Calcium 01/11/2018 8.7* 8.9 - 10.3 mg/dL Final  . GFR calc non Af Amer 01/11/2018 >60  >60 mL/min Final  . GFR calc Af Amer 01/11/2018 >60  >60 mL/min Final   Comment: (NOTE) The eGFR has been calculated using the CKD EPI equation. This calculation has not been validated in all clinical situations. eGFR's persistently <60 mL/min signify possible Chronic Kidney Disease.   Georgiann Hahn gap 01/11/2018 5  5 - 15 Final   Performed at [facility], [place] [address] [place],  [address]  . WBC 01/12/2018 8.2  4.0 - 10.5 K/uL Final  . RBC 01/12/2018 3.59* 3.87 - 5.11 MIL/uL Final  . Hemoglobin 01/12/2018 11.0* 12.0 - 15.0 g/dL Final  . HCT 01/12/2018 32.7* 36.0 - 46.0 % Final  . MCV 01/12/2018 91.1  78.0 - 100.0 fL Final  . MCH 01/12/2018 30.6  26.0 - 34.0 pg Final  . MCHC 01/12/2018 33.6  30.0 - 36.0 g/dL Final  . RDW 01/12/2018 13.2  11.5 - 15.5 % Final  . Platelets 01/12/2018 130* 150 - 400 K/uL Final   Performed at [facility], [place] [address], [place], [place] [address]  . Sodium 01/12/2018 143  135 - 145 mmol/L Final  . Potassium 01/12/2018 4.2  3.5 - 5.1 mmol/L Final  . Chloride 01/12/2018 108  98 - 111 mmol/L Final  . CO2 01/12/2018 27  22 - 32 mmol/L Final  . Glucose, Bld 01/12/2018 112* 70 - 99 mg/dL Final  . BUN 01/12/2018 17  8 - 23 mg/dL Final  . Creatinine, Ser 01/12/2018 0.70  0.44 - 1.00 mg/dL Final  . Calcium 01/12/2018 8.7* 8.9 - 10.3 mg/dL  Final  . GFR calc non Af Amer 01/12/2018 >60  >60 mL/min Final  . GFR calc Af Amer 01/12/2018 >60  >60 mL/min Final   Comment: (NOTE) The eGFR has been calculated using the CKD EPI equation. This calculation has not been validated in all clinical situations. eGFR's persistently <60 mL/min signify possible Chronic Kidney Disease.   Georgiann Hahn gap 01/12/2018 8  5 - 15 Final   Performed at [facility], [place] [address] [place], [place] [address]  Hospital Outpatient Visit on 01/05/2018  Component Date Value Ref Range Status  . MRSA, PCR 01/05/2018 NEGATIVE  NEGATIVE Final  . Staphylococcus aureus 01/05/2018 NEGATIVE  NEGATIVE Final   Comment: (NOTE) The Xpert SA Assay (FDA approved for NASAL specimens in patients 48 years of age and older), is one component of a comprehensive surveillance program. It is not intended to diagnose infection nor to guide or monitor treatment. Performed at [facility], [place] [address], [place], [place] [address]   . aPTT 01/05/2018 27  24 - 36 seconds Final   Performed at [facility], [place] [address] [place], [place] [address]  . WBC 01/05/2018 5.3  4.0 - 10.5 K/uL Final  . RBC 01/05/2018 4.97  3.87 - 5.11 MIL/uL Final  . Hemoglobin 01/05/2018 15.2* 12.0 - 15.0 g/dL Final  . HCT 01/05/2018 44.7  36.0 - 46.0 % Final  . MCV 01/05/2018 89.9  78.0 - 100.0 fL Final  . MCH 01/05/2018 30.6  26.0 - 34.0 pg Final  . MCHC 01/05/2018 34.0  30.0 - 36.0 g/dL Final  . RDW 01/05/2018 12.7  11.5 - 15.5 % Final  . Platelets 01/05/2018 158  150 - 400 K/uL Final   Performed at [facility], [place] [address], [place], [place] [address]  . Sodium 01/05/2018 144  135 - 145 mmol/L Final  . Potassium 01/05/2018 4.9  3.5 - 5.1 mmol/L Final  . Chloride 01/05/2018 107  98 - 111 mmol/L Final  . CO2 01/05/2018 26  22 - 32 mmol/L Final  . Glucose, Bld 01/05/2018 100* 70 - 99 mg/dL Final  . BUN 01/05/2018 15  8  - 23 mg/dL Final  . Creatinine, Ser 01/05/2018 0.86  0.44 - 1.00 mg/dL Final  . Calcium 01/05/2018 9.7  8.9 - 10.3 mg/dL Final  . Total Protein 01/05/2018 6.9  6.5 - 8.1 g/dL Final  . Albumin 01/05/2018 4.6  3.5 - 5.0 g/dL Final  . AST 01/05/2018 26  15 - 41 U/L Final  . ALT 01/05/2018 24  0 - 44 U/L Final  . Alkaline Phosphatase 01/05/2018 44  38 - 126 U/L Final  .  Total Bilirubin 01/05/2018 1.1  0.3 - 1.2 mg/dL Final  . GFR calc non Af Amer 01/05/2018 >60  >60 mL/min Final  . GFR calc Af Amer 01/05/2018 >60  >60 mL/min Final   Comment: (NOTE) The eGFR has been calculated using the CKD EPI equation. This calculation has not been validated in all clinical situations. eGFR's persistently <60 mL/min signify possible Chronic Kidney Disease.   Georgiann Hahn gap 01/05/2018 11  5 - 15 Final   Performed at [facility], [place] [address], [place], [place] [address]  . Prothrombin Time 01/05/2018 11.9  11.4 - 15.2 seconds Final  . INR 01/05/2018 0.88   Final   Performed at [facility], [place] [address], [place], [place] [address]  . ABO/RH(D) 01/05/2018 A POS   Final  . Antibody Screen 01/05/2018 NEG   Final  . Sample Expiration 01/05/2018 01/13/2018   Final  . Extend sample reason 01/05/2018    Final                   Value:NO TRANSFUSIONS OR PREGNANCY IN THE PAST 3 MONTHS Performed at [facility], [place] [address] [place], [place] [address]   . ABO/RH(D) 01/05/2018    Final                   Value:A POS Performed at [facility], [place] [address] [place], [place] [address]      X-Rays:No results found.  EKG:No orders found for this or any previous visit.   Hospital Course: Angela Collier is a 72 y.o. who was admitted to [facility]. They were brought to the operating room on 01/10/2018 and underwent Procedure(s): RIGHT TOTAL KNEE ARTHROPLASTY.  Patient tolerated the procedure well and was later transferred to  the recovery room and then to the orthopaedic floor for postoperative care. They were given PO and IV analgesics for pain control following their surgery. They were given 24 hours of postoperative antibiotics of  Anti-infectives (From admission, onward)   Start     Dose/Rate Route Frequency Ordered Stop   01/10/18 1800  ceFAZolin (ANCEF) IVPB 1 g/50 mL premix     1 g 100 mL/hr over 30 Minutes Intravenous Every 6 hours 01/10/18 1540 01/11/18 0007   01/10/18 0911  ceFAZolin (ANCEF) 2-4 GM/100ML-% IVPB    Note to Pharmacy:  Randa Evens  : cabinet override      01/10/18 0911 01/10/18 1121   01/10/18 0906  vancomycin (VANCOCIN) 1-5 GM/200ML-% IVPB  Status:  Discontinued    Note to Pharmacy:  Randa Evens  : cabinet override      01/10/18 0906 01/10/18 0912   01/10/18 0900  ceFAZolin (ANCEF) IVPB 2g/100 mL premix     2 g 200 mL/hr over 30 Minutes Intravenous On call to O.R. 01/10/18 2119 01/10/18 1136     and started on DVT prophylaxis in the form of Xarelto.   PT and OT were ordered for total joint protocol. Discharge planning consulted to help with postop disposition and equipment needs. Patient had a good night on the evening of surgery. They started to get up OOB with therapy on POD #1. Hemovac drain was pulled without difficulty on day one. BP was noted to be soft at 100/55 mmHg with a HR of 58. 250 mL bolus ordered with improvement in both vital signs. Continued to work with therapy into POD #2. Pt was seen during rounds on day two and  was ready to go home pending progress with therapy. Dressing was changed and the incision was clean, dry, and intact with no drainage. Pt worked with therapy for one additional session and was meeting their goals. She was discharged to home later that day in stable condition.  Diet: Regular diet Activity: WBAT Follow-up: in 2 weeks with Dr. Wynelle Link Disposition: Home with HHPT followed by outpatient therapy at [facility] Discharged Condition:  stable   Discharge Instructions    Call MD / Call 911   Complete by:  As directed    If you experience chest pain or shortness of breath, CALL 911 and be transported to the hospital emergency room.  If you develope a fever above 101 F, pus (white drainage) or increased drainage or redness at the wound, or calf pain, call your surgeon's office.   Change dressing   Complete by:  As directed    Change the dressing daily with sterile 4 x 4 inch gauze dressing and apply TED hose.   Constipation Prevention   Complete by:  As directed    Drink plenty of fluids.  Prune juice may be helpful.  You may use a stool softener, such as Colace (over the counter) 100 mg twice a day.  Use MiraLax (over the counter) for constipation as needed.   Diet - low sodium heart healthy   Complete by:  As directed    Discharge instructions   Complete by:  As directed    Dr. Gaynelle Arabian Total Joint Specialist [facility] [address], [place], [place] [address] [phone]  TOTAL KNEE REPLACEMENT POSTOPERATIVE DIRECTIONS  Knee Rehabilitation, Guidelines Following Surgery  Results after knee surgery are often greatly improved when you follow the exercise, range of motion and muscle strengthening exercises prescribed by your doctor. Safety measures are also important to protect the knee from further injury. Any time any of these exercises cause you to have increased pain or swelling in your knee joint, decrease the amount until you are comfortable again and slowly increase them. If you have problems or questions, call your caregiver or physical therapist for advice.   HOME CARE INSTRUCTIONS  Remove items at home which could result in a fall. This includes throw rugs or furniture in walking pathways.  ICE to the affected knee every three hours for 30 minutes at a time and then as needed for pain and swelling.  Continue to use ice on the knee for pain and swelling from surgery. You may notice swelling  that will progress down to the foot and ankle.  This is normal after surgery.  Elevate the leg when you are not up walking on it.   Continue to use the breathing machine which will help keep your temperature down.  It is common for your temperature to cycle up and down following surgery, especially at night when you are not up moving around and exerting yourself.  The breathing machine keeps your lungs expanded and your temperature down. Do not place pillow under knee, focus on keeping the knee straight while resting   DIET You may resume your previous home diet once your are discharged from the hospital.  DRESSING / WOUND CARE / SHOWERING You may shower 3 days after surgery, but keep the wounds dry during showering.  You may use an occlusive plastic wrap (Press'n Seal for example), NO SOAKING/SUBMERGING IN THE BATHTUB.  If the bandage gets wet, change with a clean dry gauze.  If the incision gets wet,  pat the wound dry with a clean towel. You may start showering once you are discharged home but do not submerge the incision under water. Just pat the incision dry and apply a dry gauze dressing on daily. Change the surgical dressing daily and reapply a dry dressing each time.  ACTIVITY Walk with your walker as instructed. Use walker as long as suggested by your caregivers. Avoid periods of inactivity such as sitting longer than an hour when not asleep. This helps prevent blood clots.  You may resume a sexual relationship in one month or when given the OK by your doctor.  You may return to work once you are cleared by your doctor.  Do not drive a car for 6 weeks or until released by you surgeon.  Do not drive while taking narcotics.  WEIGHT BEARING Weight bearing as tolerated with assist device (walker, cane, etc) as directed, use it as long as suggested by your surgeon or therapist, typically at least 4-6 weeks.  POSTOPERATIVE CONSTIPATION PROTOCOL Constipation - defined medically as fewer  than three stools per week and severe constipation as less than one stool per week.  One of the most common issues patients have following surgery is constipation.  Even if you have a regular bowel pattern at home, your normal regimen is likely to be disrupted due to multiple reasons following surgery.  Combination of anesthesia, postoperative narcotics, change in appetite and fluid intake all can affect your bowels.  In order to avoid complications following surgery, here are some recommendations in order to help you during your recovery period.  Colace (docusate) - Pick up an over-the-counter form of Colace or another stool softener and take twice a day as long as you are requiring postoperative pain medications.  Take with a full glass of water daily.  If you experience loose stools or diarrhea, hold the colace until you stool forms back up.  If your symptoms do not get better within 1 week or if they get worse, check with your doctor.  Dulcolax (bisacodyl) - Pick up over-the-counter and take as directed by the product packaging as needed to assist with the movement of your bowels.  Take with a full glass of water.  Use this product as needed if not relieved by Colace only.   MiraLax (polyethylene glycol) - Pick up over-the-counter to have on hand.  MiraLax is a solution that will increase the amount of water in your bowels to assist with bowel movements.  Take as directed and can mix with a glass of water, juice, soda, coffee, or tea.  Take if you go more than two days without a movement. Do not use MiraLax more than once per day. Call your doctor if you are still constipated or irregular after using this medication for 7 days in a row.  If you continue to have problems with postoperative constipation, please contact the office for further assistance and recommendations.  If you experience "the worst abdominal pain ever" or develop nausea or vomiting, please contact the office immediatly for further  recommendations for treatment.  ITCHING  If you experience itching with your medications, try taking only a single pain pill, or even half a pain pill at a time.  You can also use Benadryl over the counter for itching or also to help with sleep.   TED HOSE STOCKINGS Wear the elastic stockings on both legs for three weeks following surgery during the day but you may remove then at night for sleeping.  MEDICATIONS See your medication summary on the "After Visit Summary" that the nursing staff will review with you prior to discharge.  You may have some home medications which will be placed on hold until you complete the course of blood thinner medication.  It is important for you to complete the blood thinner medication as prescribed by your surgeon.  Continue your approved medications as instructed at time of discharge.  PRECAUTIONS If you experience chest pain or shortness of breath - call 911 immediately for transfer to the hospital emergency department.  If you develop a fever greater that 101 F, purulent drainage from wound, increased redness or drainage from wound, foul odor from the wound/dressing, or calf pain - CONTACT YOUR SURGEON.                                                   FOLLOW-UP APPOINTMENTS Make sure you keep all of your appointments after your operation with your surgeon and caregivers. You should call the office at the above phone number and make an appointment for approximately two weeks after the date of your surgery or on the date instructed by your surgeon outlined in the "After Visit Summary".   RANGE OF MOTION AND STRENGTHENING EXERCISES  Rehabilitation of the knee is important following a knee injury or an operation. After just a few days of immobilization, the muscles of the thigh which control the knee become weakened and shrink (atrophy). Knee exercises are designed to build up the tone and strength of the thigh muscles and to improve knee motion. Often times heat  used for twenty to thirty minutes before working out will loosen up your tissues and help with improving the range of motion but do not use heat for the first two weeks following surgery. These exercises can be done on a training (exercise) mat, on the floor, on a table or on a bed. Use what ever works the best and is most comfortable for you Knee exercises include:  Leg Lifts - While your knee is still immobilized in a splint or cast, you can do straight leg raises. Lift the leg to 60 degrees, hold for 3 sec, and slowly lower the leg. Repeat 10-20 times 2-3 times daily. Perform this exercise against resistance later as your knee gets better.  Quad and Hamstring Sets - Tighten up the muscle on the front of the thigh (Quad) and hold for 5-10 sec. Repeat this 10-20 times hourly. Hamstring sets are done by pushing the foot backward against an object and holding for 5-10 sec. Repeat as with quad sets.  Leg Slides: Lying on your back, slowly slide your foot toward your buttocks, bending your knee up off the floor (only go as far as is comfortable). Then slowly slide your foot back down until your leg is flat on the floor again. Angel Wings: Lying on your back spread your legs to the side as far apart as you can without causing discomfort.  A rehabilitation program following serious knee injuries can speed recovery and prevent re-injury in the future due to weakened muscles. Contact your doctor or a physical therapist for more information on knee rehabilitation.   IF YOU ARE TRANSFERRED TO A SKILLED REHAB FACILITY If the patient is transferred to a skilled rehab facility following release from the hospital, a list of the current medications  will be sent to the facility for the patient to continue.  When discharged from the skilled rehab facility, please have the facility set up the patient's [place] prior to being released. Also, the skilled facility will be responsible for providing the  patient with their medications at time of release from the facility to include their pain medication, the muscle relaxants, and their blood thinner medication. If the patient is still at the rehab facility at time of the two week follow up appointment, the skilled rehab facility will also need to assist the patient in arranging follow up appointment in our office and any transportation needs.  MAKE SURE YOU:  Understand these instructions.  Get help right away if you are not doing well or get worse.    Pick up stool softner and laxative for home use following surgery while on pain medications. Do not submerge incision under water. Please use good hand washing techniques while changing dressing each day. May shower starting three days after surgery. Please use a clean towel to pat the incision dry following showers. Continue to use ice for pain and swelling after surgery. Do not use any lotions or creams on the incision until instructed by your surgeon.   Do not put a pillow under the knee. Place it under the heel.   Complete by:  As directed    Driving restrictions   Complete by:  As directed    No driving for two weeks   TED hose   Complete by:  As directed    Use stockings (TED hose) for three weeks on both leg(s).  You may remove them at night for sleeping.   Weight bearing as tolerated   Complete by:  As directed      Allergies as of 01/12/2018      Reactions   Sulfa Antibiotics Itching   Sulfasalazine Itching      Medication List    STOP taking these medications   ADVIL 200 MG tablet Generic drug:  ibuprofen   B-complex with vitamin C tablet   CAL-MAG-ZINC PO   Coenzyme Q10 300 MG Caps   COLLAGEN PLUS VITAMIN C PO   FISH OIL ULTRA 1400 MG Caps   magnesium oxide 400 MG tablet Commonly known as:  MAG-OX   PRESERVISION AREDS 2 PO   Red Yeast Rice 600 MG Caps   TURMERIC PO   Vitamin D3 2000 units Tabs   vitamin E 400 UNIT capsule     TAKE these  medications   methocarbamol 500 MG tablet Commonly known as:  ROBAXIN Take 1 tablet (500 mg total) by mouth every 6 (six) hours as needed for muscle spasms.   NON FORMULARY Take 1 Device by mouth at bedtime. EMA oral device for the treatment of macular degeneration   oxyCODONE 5 MG immediate release tablet Commonly known as:  Oxy IR/ROXICODONE Take 1-2 tablets (5-10 mg total) by mouth every 6 (six) hours as needed for severe pain (pain score 4-6).   REFRESH TEARS 0.5 % Soln Generic drug:  carboxymethylcellulose Place 1 drop into both eyes 3 (three) times daily as needed (for dry eyes.).   rivaroxaban 10 MG Tabs tablet Commonly known as:  XARELTO Take 1 tablet (10 mg total) by mouth daily with breakfast for 19 days. Then take one 81 mg aspirin once a day for three weeks. Then discontinue aspirin.   traMADol 50 MG tablet Commonly known as:  ULTRAM Take 1-2 tablets (50-100 mg total) by  mouth every 6 (six) hours as needed for moderate pain.   UNISOM SLEEPTABS 25 MG tablet Generic drug:  doxylamine (Sleep) Take 25 mg by mouth at bedtime as needed for sleep.            Discharge Care Instructions  (From admission, onward)         Start     Ordered   01/12/18 0000  Weight bearing as tolerated     01/12/18 0729   01/12/18 0000  Change dressing    Comments:  Change the dressing daily with sterile 4 x 4 inch gauze dressing and apply TED hose.   01/12/18 0729         Follow-up Information    Gaynelle Arabian, MD. Schedule an appointment as soon as possible for a visit on 01/25/2018.   Specialty:  Orthopedic Surgery Contact information: [address] [place] [address] [phone]        [facility] Follow up.   Specialty:  Home Health Services Why:  physical therapy Contact information: [address] [place] [address] [phone]           Signed: Theresa Duty, PA-C Orthopedic Surgery 01/17/2018, 11:19 AM

## 2018-05-16 ENCOUNTER — Telehealth: Payer: Self-pay | Admitting: Rheumatology

## 2018-05-16 DIAGNOSIS — M19042 Primary osteoarthritis, left hand: Principal | ICD-10-CM

## 2018-05-16 DIAGNOSIS — M19041 Primary osteoarthritis, right hand: Secondary | ICD-10-CM

## 2018-05-16 NOTE — Telephone Encounter (Signed)
Patient called stating she lost her ring splinter for her left hand pinky finger and is requesting another prescription.  Patient requested a return call.

## 2018-05-18 NOTE — Telephone Encounter (Signed)
Referral placed and patient advised. Referral faxed.

## 2018-05-25 NOTE — Progress Notes (Signed)
Office Visit Note  Patient: Angela Collier             Date of Birth: 1945/10/08           MRN: [id number]             PCP: Mayra Neer, MD Referring: Mayra Neer, MD Visit Date: 06/08/2018 Occupation: @GUAROCC @  Subjective:  Discuss DXA  History of Present Illness: Angela Collier is a 73 y.o. female with history of inflammatory arthritis and osteoporosis.  She is taking calcium and vitamin D daily.  Patient states that she underwent right total knee replacement on January 09, 2018 by Dr. Maureen Ralphs.  She has been going through physical therapy and had good recovery.  She states she has not had much discomfort now.  Her left knee joint does not cause pain.  She has been working out on a daily basis.  She has osteoarthritis in her hands and recently lost 1 of her finger splint.  She has ordered another ring splint for her left fifth finger DIP.  She states she has not had much fluid in her knee joints as she has slowed down on her activity level.  She does not want to take any treatment for osteoporosis.  Activities of Daily Living:  Patient reports morning stiffness for 0 minute.   Patient Denies nocturnal pain.  Difficulty dressing/grooming: Denies Difficulty climbing stairs: Reports Difficulty getting out of chair: Denies Difficulty using hands for taps, buttons, cutlery, and/or writing: Denies  Review of Systems  Constitutional: Negative for fatigue, night sweats, weight gain and weight loss.  HENT: Negative for mouth sores, trouble swallowing, trouble swallowing, mouth dryness and nose dryness.   Eyes: Negative for pain, redness, visual disturbance and dryness.  Respiratory: Negative for cough, hemoptysis, shortness of breath and difficulty breathing.   Cardiovascular: Negative for chest pain, palpitations, hypertension, irregular heartbeat and swelling in legs/feet.  Gastrointestinal: Negative for blood in stool, constipation and diarrhea.  Endocrine: Negative for  increased urination.  Genitourinary: Negative for painful urination and vaginal dryness.  Musculoskeletal: Negative for arthralgias, joint pain, joint swelling, myalgias, muscle weakness, morning stiffness, muscle tenderness and myalgias.  Skin: Negative for color change, pallor, rash, hair loss, nodules/bumps, skin tightness, ulcers and sensitivity to sunlight.  Allergic/Immunologic: Negative for susceptible to infections.  Neurological: Negative for dizziness, numbness, headaches, memory loss, night sweats and weakness.  Hematological: Negative for swollen glands.  Psychiatric/Behavioral: Negative for depressed mood and sleep disturbance. The patient is not nervous/anxious.     PMFS History:  Patient Active Problem List   Diagnosis Date Noted  . Osteoarthritis of right knee 01/12/2018  . OA (osteoarthritis) of knee 01/10/2018  . Age-related osteoporosis without current pathological fracture 08/12/2016  . Osteoarthritis of both knees 02/24/2016  . Knee effusion, right 02/24/2016  . Patellar luxation 02/24/2016  . Osteoarthritis of both feet 02/24/2016  . Osteoarthritis of both hands 02/24/2016  . Macular degeneration 02/24/2016  . Varicose veins of lower extremities with other complications 83/15/1761    Past Medical History:  Diagnosis Date  . Arthritis    large joint   . Hyperlipidemia   . Macular degeneration   . Osteopenia   . Osteoporosis   . Pneumonia    childhood age 25   . Varicose veins     Family History  Problem Relation Age of Onset  . Osteoarthritis Mother   . Rheum arthritis Mother   . Cancer Father        liver  .  Heart disease Father   . Hyperlipidemia Father   . Hypertension Father   . Heart attack Father   . Other Father        varicose veins  . Hyperlipidemia Sister   . Healthy Son   . Healthy Daughter    Past Surgical History:  Procedure Laterality Date  . CATARACT EXTRACTION Right    2011  . FRACTURE SURGERY  2007   BIL wrist  . PARTIAL  HYSTERECTOMY    . TOTAL KNEE ARTHROPLASTY Right 01/10/2018   Procedure: RIGHT TOTAL KNEE ARTHROPLASTY;  Surgeon: Gaynelle Arabian, MD;  Location: [facility];  Service: Orthopedics;  Laterality: Right;  . WRIST FRACTURE SURGERY Bilateral 2004   Social History   Social History Narrative  . Not on file    There is no immunization history on file for this patient.   Objective: Vital Signs: BP 121/78 (BP Location: Left Arm, Patient Position: Sitting, Cuff Size: Normal)   Pulse 73   Resp 12   Ht 5\' 3"  (1.6 m)   Wt 146 lb (66.2 kg)   BMI 25.86 kg/m    Physical Exam Vitals signs and nursing note reviewed.  Constitutional:      Appearance: She is well-developed.  HENT:     Head: Normocephalic and atraumatic.  Eyes:     Conjunctiva/sclera: Conjunctivae normal.  Neck:     Musculoskeletal: Normal range of motion.  Cardiovascular:     Rate and Rhythm: Normal rate and regular rhythm.     Heart sounds: Normal heart sounds.  Pulmonary:     Effort: Pulmonary effort is normal.     Breath sounds: Normal breath sounds.  Abdominal:     General: Bowel sounds are normal.     Palpations: Abdomen is soft.  Lymphadenopathy:     Cervical: No cervical adenopathy.  Skin:    General: Skin is warm and dry.     Capillary Refill: Capillary refill takes less than 2 seconds.  Neurological:     Mental Status: She is alert and oriented to person, place, and time.  Psychiatric:        Behavior: Behavior normal.      Musculoskeletal Exam: C-spine thoracic and lumbar spine good range of motion.  Shoulder joints elbow joints wrist joints with good range of motion.  She has severe osteoarthritis in her hands with DIP and PIP thickening and subluxation of some of the DIP joints.  Hip joints are in good range of motion.  Right knee joint has been replaced which has some warmth and small effusion.  Left knee joint has patellar laxity and some crepitus but no fluid was noted today.  She has severe osteoarthritic  changes in her feet.  CDAI Exam: CDAI Score: Not documented Patient Global Assessment: Not documented; Provider Global Assessment: Not documented Swollen: Not documented; Tender: Not documented Joint Exam   Not documented   There is currently no information documented on the homunculus. Go to the Rheumatology activity and complete the homunculus joint exam.  Investigation: No additional findings.  Imaging: No results found.  Recent Labs: Lab Results  Component Value Date   WBC 8.2 01/12/2018   HGB 11.0 (L) 01/12/2018   PLT 130 (L) 01/12/2018   NA 143 01/12/2018   K 4.2 01/12/2018   CL 108 01/12/2018   CO2 27 01/12/2018   GLUCOSE 112 (H) 01/12/2018   BUN 17 01/12/2018   CREATININE 0.70 01/12/2018   BILITOT 1.1 01/05/2018   ALKPHOS 44 01/05/2018  AST 26 01/05/2018   ALT 24 01/05/2018   PROT 6.9 01/05/2018   ALBUMIN 4.6 01/05/2018   CALCIUM 8.7 (L) 01/12/2018   GFRAA >60 01/12/2018    Speciality Comments: No specialty comments available.  Procedures:  No procedures performed Allergies: Sulfa antibiotics and Sulfasalazine   Assessment / Plan:     Visit Diagnoses: Inflammatory arthritis - Seronegative, history of recurrent knee effusions.  She has not had any infusion in a long time.  She had small effusion in her right knee joint which was replaced.  Status post total knee replacement, right - January 09, 2018 by Dr. Maureen Ralphs.  She is going to physical therapy which is been helpful.  Primary osteoarthritis of left knee-she currently does not have much discomfort in her left knee and no effusion was noted.  Primary osteoarthritis of both hands-she has severe osteoarthritis in her hands with the DIP subluxation.  She has been using wearing a splint which has been helpful.  Primary osteoarthritis of both feet-she has severe osteoarthritis in her feet but it does not limit her with her activities.  She has been using proper fitting shoes.  Patellar dislocation,  unspecified laterality, subsequent encounter  Age-related osteoporosis without current pathological fracture - DXA on July 29, 2016 showed T score of -2.5 at [place].  We had detailed discussion regarding bone density.  She does not want to take any medications.  She will continue with calcium and vitamin D.  She will need repeat bone density in April 2020.  I have advised her to discuss that further with her PCP.  Orders: No orders of the defined types were placed in this encounter.  No orders of the defined types were placed in this encounter.   Face-to-face time spent with patient was 30 minutes. Greater than 50% of time was spent in counseling and coordination of care.  Follow-Up Instructions: Return in about 1 year (around 06/09/2019) for Osteoporosis, Osteoarthritis.   Bo Merino, MD  Note - This record has been created using Editor, commissioning.  Chart creation errors have been sought, but may not always  have been located. Such creation errors do not reflect on  the standard of medical care.

## 2018-06-08 ENCOUNTER — Ambulatory Visit: Payer: Medicare Other | Admitting: Rheumatology

## 2018-06-08 ENCOUNTER — Encounter: Payer: Self-pay | Admitting: Rheumatology

## 2018-06-08 VITALS — BP 121/78 | HR 73 | Resp 12 | Ht 63.0 in | Wt 146.0 lb

## 2018-06-08 DIAGNOSIS — M1990 Unspecified osteoarthritis, unspecified site: Secondary | ICD-10-CM | POA: Diagnosis not present

## 2018-06-08 DIAGNOSIS — Z96651 Presence of right artificial knee joint: Secondary | ICD-10-CM

## 2018-06-08 DIAGNOSIS — M19041 Primary osteoarthritis, right hand: Secondary | ICD-10-CM | POA: Diagnosis not present

## 2018-06-08 DIAGNOSIS — M19071 Primary osteoarthritis, right ankle and foot: Secondary | ICD-10-CM

## 2018-06-08 DIAGNOSIS — S83006D Unspecified dislocation of unspecified patella, subsequent encounter: Secondary | ICD-10-CM

## 2018-06-08 DIAGNOSIS — M19072 Primary osteoarthritis, left ankle and foot: Secondary | ICD-10-CM

## 2018-06-08 DIAGNOSIS — M1712 Unilateral primary osteoarthritis, left knee: Secondary | ICD-10-CM

## 2018-06-08 DIAGNOSIS — M810 Age-related osteoporosis without current pathological fracture: Secondary | ICD-10-CM

## 2018-06-08 DIAGNOSIS — M19042 Primary osteoarthritis, left hand: Secondary | ICD-10-CM

## 2018-08-17 ENCOUNTER — Ambulatory Visit: Payer: Medicare Other | Admitting: Rheumatology

## 2019-01-19 ENCOUNTER — Telehealth: Payer: Self-pay | Admitting: Rheumatology

## 2019-01-19 DIAGNOSIS — M19041 Primary osteoarthritis, right hand: Secondary | ICD-10-CM

## 2019-01-19 DIAGNOSIS — M19042 Primary osteoarthritis, left hand: Secondary | ICD-10-CM

## 2019-01-19 NOTE — Telephone Encounter (Signed)
Patient is requesting another referral to the hand center for a splint for her lt index finger. Patient received one for last finger, and now having issues with lt index finger. Please call to advise.

## 2019-01-20 ENCOUNTER — Telehealth: Payer: Self-pay | Admitting: Rheumatology

## 2019-01-20 NOTE — Telephone Encounter (Signed)
Patient advised referral has been placed. Patient advised that the person who works on the referrals in our office is not in today and she will be back next week. Patient advised that she will be able to take care of when she returns.

## 2019-01-20 NOTE — Telephone Encounter (Signed)
Patient called checking the status of her referral to the hand center for a splint for her left finger.  Patient is requesting a return call to let her know if it is possible for the referral to be sent this afternoon so she can schedule an appointment.

## 2019-01-20 NOTE — Telephone Encounter (Signed)
Ok to place referral to the hand center.

## 2019-01-24 ENCOUNTER — Telehealth: Payer: Self-pay | Admitting: Rheumatology

## 2019-01-24 NOTE — Telephone Encounter (Signed)
Patient called stating she just spoke with the [facility] and was told they have not received a referral from Dr. Estanislado Pandy for her splint.  Patient states she is going out of town on Sunday, 01/29/19 and will be gone for 3 weeks.  Patient states she was hoping to schedule the appointment before she left.  Patient is requesting a return call.

## 2019-01-25 NOTE — Telephone Encounter (Signed)
Refaxed referral x 2, tried to call [facility], phone will not ring.

## 2019-01-26 NOTE — Telephone Encounter (Signed)
Appt 01/26/2019

## 2019-05-12 ENCOUNTER — Ambulatory Visit: Payer: Medicare Other

## 2019-05-20 ENCOUNTER — Ambulatory Visit: Payer: Medicare Other | Attending: Internal Medicine

## 2019-05-20 DIAGNOSIS — Z23 Encounter for immunization: Secondary | ICD-10-CM | POA: Insufficient documentation

## 2019-05-20 NOTE — Progress Notes (Signed)
   Covid-19 Vaccination Clinic  Name:  Angela Collier    MRN: [id number] DOB: 19-Oct-1945  05/20/2019  Angela Collier was observed post Covid-19 immunization for 15 minutes without incidence. She was provided with Vaccine Information Sheet and instruction to access the V-Safe system.   Angela Collier was instructed to call 911 with any severe reactions post vaccine: Marland Kitchen Difficulty breathing  . Swelling of your face and throat  . A fast heartbeat  . A bad rash all over your body  . Dizziness and weakness    Immunizations Administered    Name Date Dose VIS Date Route   Pfizer COVID-19 Vaccine 05/20/2019  3:27 PM 0.3 mL 03/24/2019 Intramuscular   Manufacturer: [place]   Lot: [id number]   [place]: [id number]

## 2019-05-23 ENCOUNTER — Ambulatory Visit: Payer: Medicare Other

## 2019-06-02 NOTE — Progress Notes (Signed)
Office Visit Note  Patient: Angela Collier             Date of Birth: [birth year]           MRN: [id number]             PCP: Mayra Neer, MD Referring: Mayra Neer, MD Visit Date: 06/07/2019 Occupation: @GUAROCC @  Subjective:  Deformity of hands.   History of Present Illness: Angela Collier is a 74 y.o. female with history of inflammatory osteoarthritis.  She states her right total knee replacement is doing well.  She has some discomfort in the left knee.  She has deformities in her DIP joints and which are subluxed.  She states she had a ring splint for her left index finger which she lost.  She would like to get another one.  The left ring finger splint has been very useful.  She has arthritis in her feet but they are not bothersome.  Left knee joint is feeling better.  Activities of Daily Living:  Patient reports morning stiffness for 0 minutes.   Patient Denies nocturnal pain.  Difficulty dressing/grooming: Denies Difficulty climbing stairs: Denies Difficulty getting out of chair: Denies Difficulty using hands for taps, buttons, cutlery, and/or writing: Denies  Review of Systems  Constitutional: Negative for fatigue.  HENT: Negative for mouth sores, mouth dryness and nose dryness.   Eyes: Negative for itching and dryness.  Respiratory: Negative for shortness of breath, wheezing and difficulty breathing.   Cardiovascular: Negative for chest pain and palpitations.  Gastrointestinal: Negative for blood in stool, constipation and diarrhea.  Endocrine: Negative for increased urination.  Genitourinary: Negative for difficulty urinating and painful urination.  Musculoskeletal: Positive for joint swelling. Negative for arthralgias, joint pain and morning stiffness.  Skin: Negative for rash.  Allergic/Immunologic: Negative for susceptible to infections.  Neurological: Negative for dizziness, headaches, memory loss and weakness.  Hematological: Negative for  bruising/bleeding tendency.  Psychiatric/Behavioral: Negative for confusion.    PMFS History:  Patient Active Problem List   Diagnosis Date Noted  . Osteoarthritis of right knee 01/12/2018  . OA (osteoarthritis) of knee 01/10/2018  . Age-related osteoporosis without current pathological fracture 08/12/2016  . Osteoarthritis of both knees 02/24/2016  . Knee effusion, right 02/24/2016  . Patellar luxation 02/24/2016  . Osteoarthritis of both feet 02/24/2016  . Osteoarthritis of both hands 02/24/2016  . Macular degeneration 02/24/2016  . Varicose veins of lower extremities with other complications [id number]    Past Medical History:  Diagnosis Date  . Arthritis    large joint   . Hyperlipidemia   . Macular degeneration   . Osteopenia   . Osteoporosis   . Pneumonia    childhood age 66   . Varicose veins     Family History  Problem Relation Age of Onset  . Osteoarthritis Mother   . Rheum arthritis Mother   . Cancer Father        liver  . Heart disease Father   . Hyperlipidemia Father   . Hypertension Father   . Heart attack Father   . Other Father        varicose veins  . Hyperlipidemia Sister   . Healthy Son   . Healthy Daughter    Past Surgical History:  Procedure Laterality Date  . CATARACT EXTRACTION Right    2011  . FRACTURE SURGERY  2007   BIL wrist  . PARTIAL HYSTERECTOMY    . TOTAL KNEE ARTHROPLASTY Right 01/10/2018   Procedure: RIGHT TOTAL  KNEE ARTHROPLASTY;  Surgeon: Gaynelle Arabian, MD;  Location: [facility];  Service: Orthopedics;  Laterality: Right;  . WRIST FRACTURE SURGERY Bilateral 2004   Social History   Social History Narrative  . Not on file   Immunization History  Administered Date(s) Administered  . PFIZER SARS-COV-2 Vaccination 05/20/2019     Objective: Vital Signs: BP 126/84 (BP Location: Left Arm, Patient Position: Sitting, Cuff Size: Normal)   Pulse 73   Resp 12   Ht 5' 2.5" (1.588 m)   Wt 150 lb (68 kg)   BMI 27.00 kg/m     Physical Exam Vitals and nursing note reviewed.  Constitutional:      Appearance: She is well-developed.  HENT:     Head: Normocephalic and atraumatic.  Eyes:     Conjunctiva/sclera: Conjunctivae normal.  Cardiovascular:     Rate and Rhythm: Normal rate and regular rhythm.     Heart sounds: Normal heart sounds.  Pulmonary:     Effort: Pulmonary effort is normal.     Breath sounds: Normal breath sounds.  Abdominal:     General: Bowel sounds are normal.     Palpations: Abdomen is soft.  Musculoskeletal:     Cervical back: Normal range of motion.  Lymphadenopathy:     Cervical: No cervical adenopathy.  Skin:    General: Skin is warm and dry.     Capillary Refill: Capillary refill takes less than 2 seconds.  Neurological:     Mental Status: She is alert and oriented to person, place, and time.  Psychiatric:        Behavior: Behavior normal.      Musculoskeletal Exam: C-spine, thoracic and lumbar spine with good range of motion.  Shoulder joints elbow joints wrist joints with good range of motion.  She has bilateral PIP and DIP thickening with subluxation of several of her DIP joints.  She had some inflammatory component to her right second MCP joint.  She had good range of motion of bilateral hip joints.  Her right knee joint has some warmth with full extension.  Left knee was in good range of motion without swelling.  Ankle joints in good range of motion.  She has subluxation of most of her MTP joints without any inflammatory process. CDAI Exam: CDAI Score: -- Patient Global: --; Provider Global: -- Swollen: --; Tender: -- Joint Exam 06/07/2019   No joint exam has been documented for this visit   There is currently no information documented on the homunculus. Go to the Rheumatology activity and complete the homunculus joint exam.  Investigation: No additional findings.  Imaging: No results found.  Recent Labs: Lab Results  Component Value Date   WBC 8.2 01/12/2018    HGB 11.0 (L) 01/12/2018   PLT 130 (L) 01/12/2018   NA 143 01/12/2018   K 4.2 01/12/2018   CL 108 01/12/2018   CO2 27 01/12/2018   GLUCOSE 112 (H) 01/12/2018   BUN 17 01/12/2018   CREATININE 0.70 01/12/2018   BILITOT 1.1 01/05/2018   ALKPHOS 44 01/05/2018   AST 26 01/05/2018   ALT 24 01/05/2018   PROT 6.9 01/05/2018   ALBUMIN 4.6 01/05/2018   CALCIUM 8.7 (L) 01/12/2018   GFRAA >60 01/12/2018    Speciality Comments: No specialty comments available.  Procedures:  No procedures performed Allergies: Sulfa antibiotics and Sulfasalazine   Assessment / Plan:     Visit Diagnoses: Inflammatory arthritis - Seronegative, history of recurrent knee effusions.  Patient has not had knee  effusions since she had the right knee replacement.  Her left knee joint was without any effusion or warmth today.  She had evaluation at [facility] rheumatology in 2018 over also in agreement with inflammatory osteoarthritis.  Status post total knee replacement, right - January 09, 2018 by Dr. Maureen Ralphs.   Primary osteoarthritis of both hands -she has severe osteoarthritis in her hands with DIP subluxation.  She is interested in getting a ring splint for her left index finger which she lost.  Plan: Ambulatory referral to Hand Surgery  Primary osteoarthritis of left knee-she states the pain is manageable currently and she has been walking a lot.  Primary osteoarthritis of both feet-she has severe osteoarthritis in her feet with MTP subluxation.  Patellar dislocation, unspecified laterality, subsequent encounter  Age-related osteoporosis without current pathological fracture - DXA on July 29, 2016 showed T score of -2.5 at [place].  Patient believes that she had bone density last year.  Have advised her to get those reports for me to review.  Use of calcium, vitamin D and resistive exercise was discussed.  Orders: Orders Placed This Encounter  Procedures  . Ambulatory referral to Hand Surgery   No  orders of the defined types were placed in this encounter.     Follow-Up Instructions: Return in about 1 year (around 06/06/2020) for Osteoarthritis, Osteoporosis.   Bo Merino, MD  Note - This record has been created using Editor, commissioning.  Chart creation errors have been sought, but may not always  have been located. Such creation errors do not reflect on  the standard of medical care.

## 2019-06-07 ENCOUNTER — Encounter: Payer: Self-pay | Admitting: Rheumatology

## 2019-06-07 ENCOUNTER — Ambulatory Visit: Payer: Medicare Other | Admitting: Rheumatology

## 2019-06-07 ENCOUNTER — Other Ambulatory Visit: Payer: Self-pay

## 2019-06-07 VITALS — BP 126/84 | HR 73 | Resp 12 | Ht 62.5 in | Wt 150.0 lb

## 2019-06-07 DIAGNOSIS — M19041 Primary osteoarthritis, right hand: Secondary | ICD-10-CM

## 2019-06-07 DIAGNOSIS — S83006D Unspecified dislocation of unspecified patella, subsequent encounter: Secondary | ICD-10-CM

## 2019-06-07 DIAGNOSIS — M1712 Unilateral primary osteoarthritis, left knee: Secondary | ICD-10-CM

## 2019-06-07 DIAGNOSIS — M19072 Primary osteoarthritis, left ankle and foot: Secondary | ICD-10-CM

## 2019-06-07 DIAGNOSIS — Z96651 Presence of right artificial knee joint: Secondary | ICD-10-CM

## 2019-06-07 DIAGNOSIS — M19071 Primary osteoarthritis, right ankle and foot: Secondary | ICD-10-CM

## 2019-06-07 DIAGNOSIS — M1380 Other specified arthritis, unspecified site: Secondary | ICD-10-CM

## 2019-06-07 DIAGNOSIS — M1990 Unspecified osteoarthritis, unspecified site: Secondary | ICD-10-CM

## 2019-06-07 DIAGNOSIS — M19042 Primary osteoarthritis, left hand: Secondary | ICD-10-CM

## 2019-06-07 DIAGNOSIS — M810 Age-related osteoporosis without current pathological fracture: Secondary | ICD-10-CM

## 2019-06-13 ENCOUNTER — Ambulatory Visit: Payer: Medicare Other | Attending: Internal Medicine

## 2019-06-13 DIAGNOSIS — Z23 Encounter for immunization: Secondary | ICD-10-CM

## 2019-06-13 NOTE — Progress Notes (Signed)
   Covid-19 Vaccination Clinic  Name:  Angela Collier    MRN: [id number] DOB: Aug 11, 1945  06/13/2019  [name] was observed post Covid-19 immunization for 15 minutes without incident. She was provided with Vaccine Information Sheet and instruction to access the V-Safe system.   [name] was instructed to call 911 with any severe reactions post vaccine: Marland Kitchen Difficulty breathing  . Swelling of face and throat  . A fast heartbeat  . A bad rash all over body  . Dizziness and weakness   Immunizations Administered    Name Date Dose VIS Date Route   Pfizer COVID-19 Vaccine 06/13/2019  2:26 PM 0.3 mL 03/24/2019 Intramuscular   Manufacturer: [place]   Lot: [id number]   [place]: [id number]

## 2019-06-14 ENCOUNTER — Ambulatory Visit: Payer: Medicare Other

## 2019-08-10 ENCOUNTER — Other Ambulatory Visit: Payer: Self-pay

## 2019-08-10 ENCOUNTER — Encounter (INDEPENDENT_AMBULATORY_CARE_PROVIDER_SITE_OTHER): Payer: Self-pay | Admitting: Ophthalmology

## 2019-08-10 ENCOUNTER — Ambulatory Visit (INDEPENDENT_AMBULATORY_CARE_PROVIDER_SITE_OTHER): Payer: Medicare Other | Admitting: Ophthalmology

## 2019-08-10 DIAGNOSIS — H353112 Nonexudative age-related macular degeneration, right eye, intermediate dry stage: Secondary | ICD-10-CM | POA: Diagnosis not present

## 2019-08-10 DIAGNOSIS — H353211 Exudative age-related macular degeneration, right eye, with active choroidal neovascularization: Secondary | ICD-10-CM | POA: Diagnosis not present

## 2019-08-10 DIAGNOSIS — H35363 Drusen (degenerative) of macula, bilateral: Secondary | ICD-10-CM | POA: Diagnosis not present

## 2019-08-10 DIAGNOSIS — Z961 Presence of intraocular lens: Secondary | ICD-10-CM

## 2019-08-10 DIAGNOSIS — H353212 Exudative age-related macular degeneration, right eye, with inactive choroidal neovascularization: Secondary | ICD-10-CM | POA: Insufficient documentation

## 2019-08-10 DIAGNOSIS — H353114 Nonexudative age-related macular degeneration, right eye, advanced atrophic with subfoveal involvement: Secondary | ICD-10-CM | POA: Insufficient documentation

## 2019-08-10 DIAGNOSIS — H35721 Serous detachment of retinal pigment epithelium, right eye: Secondary | ICD-10-CM

## 2019-08-10 MED ORDER — BEVACIZUMAB CHEMO INJECTION 1.25MG/0.05ML SYRINGE FOR KALEIDOSCOPE
1.2500 mg | INTRAVITREAL | Status: AC | PRN
  Administered 2019-08-10: 12:00:00 1.25 mg via INTRAVITREAL

## 2019-08-10 NOTE — Assessment & Plan Note (Addendum)
The nature of wet macular degeneration was discussed with the patient.  Forms of therapy reviewed include the use of Anti-VEGF medications injected painlessly into the eye, as well as other possible treatment modalities, including thermal laser therapy. Fellow eye involvement and risks were discussed with the patient. Upon the finding of wet age related macular degeneration, treatment will be offered. The treatment regimen is on a treat as needed basis with the intent to treat if necessary and extend interval of exams when possible. On average 1 out of 6 patients do not need lifetime therapy. However, the risk of recurrent disease is high for a lifetime.  Initially monthly, then periodic, examinations and evaluations will determine whether the next treatment is required on the day of the examination.  OD with a history of multiple recurrences.  Improved anatomy today at 6-week interval will repeat injection today  and examination in 8 weeks

## 2019-08-10 NOTE — Progress Notes (Signed)
08/10/2019     CHIEF COMPLAINT Patient presents for Retina Follow Up   HISTORY OF PRESENT ILLNESS: Angela Collier is a 74 y.o. female who presents to the clinic today for:   HPI    Retina Follow Up    Patient presents with  Wet AMD.  In right eye.  Duration of 6 weeks.  Since onset it is stable.          Comments    6 week follow up - OCT OU, Possible Avastin OD Patient denies change in vision and overall has no complaints.        Last edited by Gerda Diss on 08/10/2019 11:10 AM. (History)      Referring physician: Mayra Neer, MD [address] [place],  [place] [address]  HISTORICAL INFORMATION:   Selected notes from the MEDICAL RECORD NUMBER       CURRENT MEDICATIONS: Current Outpatient Medications (Ophthalmic Drugs)  Medication Sig  . carboxymethylcellulose (REFRESH TEARS) 0.5 % SOLN Place 1 drop into both eyes 3 (three) times daily as needed (for dry eyes.).   No current facility-administered medications for this visit. (Ophthalmic Drugs)   Current Outpatient Medications (Other)  Medication Sig  . CALCIUM PO Take by mouth.  . Coenzyme Q10 (CO Q 10 PO) Take by mouth.  . COLLAGEN PO Take by mouth.  Marland Kitchen MAGNESIUM PO Take by mouth.  . Multiple Vitamins-Minerals (PRESERVISION AREDS 2 PO) Take by mouth 2 (two) times daily.  . NON FORMULARY Take 1 Device by mouth at bedtime. EMA oral device for the treatment of macular degeneration  . Omega-3 Fatty Acids (FISH OIL PO) Take by mouth daily.  . Red Yeast Rice Extract (RED YEAST RICE PO) Take by mouth 2 (two) times daily.  . TURMERIC PO Take by mouth.  Marland Kitchen VITAMIN D PO Take by mouth 2 (two) times daily.  Marland Kitchen VITAMIN E PO Take by mouth.   No current facility-administered medications for this visit. (Other)      REVIEW OF SYSTEMS:    ALLERGIES Allergies  Allergen Reactions  . Sulfa Antibiotics Itching  . Sulfasalazine Itching    PAST MEDICAL HISTORY Past Medical History:  Diagnosis  Date  . Arthritis    large joint   . Hyperlipidemia   . Macular degeneration   . Osteopenia   . Osteoporosis   . Pneumonia    childhood age 30   . Varicose veins    Past Surgical History:  Procedure Laterality Date  . CATARACT EXTRACTION Right    2011  . FRACTURE SURGERY  2007   BIL wrist  . PARTIAL HYSTERECTOMY    . TOTAL KNEE ARTHROPLASTY Right 01/10/2018   Procedure: RIGHT TOTAL KNEE ARTHROPLASTY;  Surgeon: Gaynelle Arabian, MD;  Location: [facility];  Service: Orthopedics;  Laterality: Right;  . WRIST FRACTURE SURGERY Bilateral 2004    FAMILY HISTORY Family History  Problem Relation Age of Onset  . Osteoarthritis Mother   . Rheum arthritis Mother   . Cancer Father        liver  . Heart disease Father   . Hyperlipidemia Father   . Hypertension Father   . Heart attack Father   . Other Father        varicose veins  . Hyperlipidemia Sister   . Healthy Son   . Healthy Daughter     SOCIAL HISTORY Social History   Tobacco Use  . Smoking status: Never Smoker  . Smokeless tobacco: Never Used  Substance Use Topics  . Alcohol use: Yes    Alcohol/week: 1.0 standard drinks    Types: 1 Glasses of wine per week    Comment: rarely  . Drug use: No         OPHTHALMIC EXAM: Base Eye Exam    Visual Acuity (Snellen - Linear)      Right Left   Dist [place] 20/200 20/20-2   Dist ph [place] 20/25-1        Tonometry (Tonopen, 11:20 AM)      Right Left   Pressure 14 14       Pupils      Pupils Dark Light Shape React APD   Right PERRL 4 3 Round Minimal None   Left PERRL 4 3 Round Minimal None       Visual Fields (Counting fingers)      Left Right    Full Full       Extraocular Movement      Right Left    Full Full       Neuro/Psych    Oriented x3: Yes   Mood/Affect: Normal       Dilation    Right eye: 1.0% Mydriacyl, 2.5% Phenylephrine @ 11:20 AM        Slit Lamp and Fundus Exam    External Exam      Right Left   External Normal Normal       Slit Lamp  Exam      Right Left   Lids/Lashes Normal Normal   Conjunctiva/Sclera White and quiet White and quiet   Cornea Clear Clear   Anterior Chamber Deep and quiet Deep and quiet   Iris Round and reactive Round and reactive   Lens Posterior chamber intraocular lens Posterior chamber intraocular lens   Vitreous Normal Normal          IMAGING AND PROCEDURES  Imaging and Procedures for 08/10/19  OCT, Retina - OU - Both Eyes       Right Eye Quality was good. Scan locations included subfoveal. Central Foveal Thickness: 269. Progression has improved. Findings include retinal drusen , central retinal atrophy, outer retinal atrophy, inner retinal atrophy, intraretinal fluid.   Left Eye Quality was good. Scan locations included temporal. Progression has been stable. Findings include outer retinal atrophy, central retinal atrophy.   Notes OD, much improved anatomy on intravitreal Avastin today at 6 weeks.       Intravitreal Injection, Pharmacologic Agent - OD - Right Eye       Time Out 08/10/2019. 12:06 PM. Confirmed correct patient, procedure, site, and patient consented.   Anesthesia Topical anesthesia was used. Anesthetic medications included Akten 3.5%.   Procedure Preparation included 10% betadine to eyelids, Tobramycin 0.3%, Ofloxacin .   Injection:  1.25 mg Bevacizumab (AVASTIN) SOLN   NDC: [id number], Lot: [id number]   Route: Intravitreal, Site: Right Eye, Waste: 0 mg  Post-op Post injection exam found visual acuity of at least counting fingers. The patient tolerated the procedure well. There were no complications. The patient received written and verbal post procedure care education. Post injection medications were not given.                 ASSESSMENT/PLAN:  Exudative age-related macular degeneration of right eye with active choroidal neovascularization (HCC) The nature of wet macular degeneration was discussed with the patient.  Forms of therapy reviewed include  the use of Anti-VEGF medications injected painlessly into the eye, as well as other possible treatment  modalities, including thermal laser therapy. Fellow eye involvement and risks were discussed with the patient. Upon the finding of wet age related macular degeneration, treatment will be offered. The treatment regimen is on a treat as needed basis with the intent to treat if necessary and extend interval of exams when possible. On average 1 out of 6 patients do not need lifetime therapy. However, the risk of recurrent disease is high for a lifetime.  Initially monthly, then periodic, examinations and evaluations will determine whether the next treatment is required on the day of the examination.  OD with a history of multiple recurrences.  Improved anatomy today at 6-week interval will repeat injection today  and examination in 8 weeks      ICD-10-CM   1. Exudative age-related macular degeneration of right eye with active choroidal neovascularization (HCC)  H35.3211 OCT, Retina - OU - Both Eyes    Intravitreal Injection, Pharmacologic Agent - OD - Right Eye    Bevacizumab (AVASTIN) SOLN 1.25 mg  2. Intermediate stage nonexudative age-related macular degeneration of right eye  H35.3112   3. Serous detachment of retinal pigment epithelium of right eye  H35.721   4. Degenerative retinal drusen of both eyes  H35.363   5. Pseudophakia  Z96.1     1.  OD, wet ARMD, improved and stable repeat injection today at 6-week interval see note above  2.  3.  Ophthalmic Meds Ordered this visit:  Meds ordered this encounter  Medications  . Bevacizumab (AVASTIN) SOLN 1.25 mg       Return in about 8 weeks (around 10/05/2019) for AVASTIN OCT, OD.  There are no Patient Instructions on file for this visit.   Explained the diagnoses, plan, and follow up with the patient and they expressed understanding.  Patient expressed understanding of the importance of proper follow up care.   Clent Demark Kahari Critzer  M.D. Diseases & Surgery of the Retina and Vitreous [facility]ina [facility]abetic [place] 08/10/19     Abbreviations: M myopia (nearsighted); A astigmatism; H hyperopia (farsighted); P presbyopia; Mrx spectacle prescription;  CTL contact lenses; OD right eye; OS left eye; OU both eyes  XT exotropia; ET esotropia; PEK punctate epithelial keratitis; PEE punctate epithelial erosions; DES dry eye syndrome; MGD meibomian gland dysfunction; ATs artificial tears; PFAT's preservative free artificial tears; [place] nuclear sclerotic cataract; PSC posterior subcapsular cataract; ERM epi-retinal membrane; PVD posterior vitreous detachment; RD retinal detachment; DM diabetes mellitus; DR diabetic retinopathy; NPDR non-proliferative diabetic retinopathy; PDR proliferative diabetic retinopathy; CSME clinically significant macular edema; DME diabetic macular edema; dbh dot blot hemorrhages; CWS cotton wool spot; POAG primary open angle glaucoma; C/D cup-to-disc ratio; HVF humphrey visual field; GVF goldmann visual field; OCT optical coherence tomography; IOP intraocular pressure; BRVO Branch retinal vein occlusion; CRVO central retinal vein occlusion; CRAO central retinal artery occlusion; BRAO branch retinal artery occlusion; RT retinal tear; SB scleral buckle; PPV pars plana vitrectomy; VH Vitreous hemorrhage; PRP panretinal laser photocoagulation; IVK intravitreal kenalog; VMT vitreomacular traction; MH Macular hole;  NVD neovascularization of the disc; NVE neovascularization elsewhere; AREDS age related eye disease study; ARMD age related macular degeneration; POAG primary open angle glaucoma; EBMD epithelial/anterior basement membrane dystrophy; ACIOL anterior chamber intraocular lens; IOL intraocular lens; PCIOL posterior chamber intraocular lens; Phaco/IOL phacoemulsification with intraocular lens placement; [place] photorefractive keratectomy; LASIK laser assisted in situ keratomileusis; HTN hypertension; DM diabetes mellitus; COPD  chronic obstructive pulmonary disease

## 2019-10-05 ENCOUNTER — Other Ambulatory Visit: Payer: Self-pay

## 2019-10-05 ENCOUNTER — Encounter (INDEPENDENT_AMBULATORY_CARE_PROVIDER_SITE_OTHER): Payer: Self-pay | Admitting: Ophthalmology

## 2019-10-05 ENCOUNTER — Ambulatory Visit (INDEPENDENT_AMBULATORY_CARE_PROVIDER_SITE_OTHER): Payer: Medicare Other | Admitting: Ophthalmology

## 2019-10-05 DIAGNOSIS — H353211 Exudative age-related macular degeneration, right eye, with active choroidal neovascularization: Secondary | ICD-10-CM

## 2019-10-05 MED ORDER — BEVACIZUMAB CHEMO INJECTION 1.25MG/0.05ML SYRINGE FOR KALEIDOSCOPE
1.2500 mg | INTRAVITREAL | Status: AC | PRN
  Administered 2019-10-05: 1.25 mg via INTRAVITREAL

## 2019-10-05 NOTE — Assessment & Plan Note (Signed)
OD with history of multiple recurrences of serous subfoveal detachment secondary to wet AMD.  Now controlled on intravitreal Avastin, examination.  Every 8 weeks.

## 2019-10-05 NOTE — Progress Notes (Signed)
10/05/2019     CHIEF COMPLAINT Patient presents for Retina Follow Up   HISTORY OF PRESENT ILLNESS: Angela Collier is a 74 y.o. female who presents to the clinic today for:   HPI    Retina Follow Up    Patient presents with  Wet AMD.  In right eye.  Severity is moderate.  Duration of 8 weeks.  Since onset it is stable.  I, the attending physician,  performed the HPI with the patient and updated documentation appropriately.          Comments    8 Week AMD f\u OD. Possible Avastin OD. OCT  Pt states no changes or issues.       Last edited by Tilda Franco on 10/05/2019 10:26 AM. (History)      Referring physician: Mayra Neer, MD [address],  [place] [address]  HISTORICAL INFORMATION:   Selected notes from the MEDICAL RECORD NUMBER       CURRENT MEDICATIONS: Current Outpatient Medications (Ophthalmic Drugs)  Medication Sig  . carboxymethylcellulose (REFRESH TEARS) 0.5 % SOLN Place 1 drop into both eyes 3 (three) times daily as needed (for dry eyes.).   No current facility-administered medications for this visit. (Ophthalmic Drugs)   Current Outpatient Medications (Other)  Medication Sig  . CALCIUM PO Take by mouth.  . Coenzyme Q10 (CO Q 10 PO) Take by mouth.  . COLLAGEN PO Take by mouth.  Marland Kitchen MAGNESIUM PO Take by mouth.  . Multiple Vitamins-Minerals (PRESERVISION AREDS 2 PO) Take by mouth 2 (two) times daily.  . NON FORMULARY Take 1 Device by mouth at bedtime. EMA oral device for the treatment of macular degeneration  . Omega-3 Fatty Acids (FISH OIL PO) Take by mouth daily.  . Red Yeast Rice Extract (RED YEAST RICE PO) Take by mouth 2 (two) times daily.  . TURMERIC PO Take by mouth.  Marland Kitchen VITAMIN D PO Take by mouth 2 (two) times daily.  Marland Kitchen VITAMIN E PO Take by mouth.   No current facility-administered medications for this visit. (Other)      REVIEW OF SYSTEMS:    ALLERGIES Allergies  Allergen Reactions  . Sulfa Antibiotics  Itching  . Sulfasalazine Itching    PAST MEDICAL HISTORY Past Medical History:  Diagnosis Date  . Arthritis    large joint   . Hyperlipidemia   . Macular degeneration   . Osteopenia   . Osteoporosis   . Pneumonia    childhood age 73   . Varicose veins    Past Surgical History:  Procedure Laterality Date  . CATARACT EXTRACTION Right    2011  . FRACTURE SURGERY  2007   BIL wrist  . PARTIAL HYSTERECTOMY    . TOTAL KNEE ARTHROPLASTY Right 01/10/2018   Procedure: RIGHT TOTAL KNEE ARTHROPLASTY;  Surgeon: Gaynelle Arabian, MD;  Location: [facility];  Service: Orthopedics;  Laterality: Right;  . WRIST FRACTURE SURGERY Bilateral 2004    FAMILY HISTORY Family History  Problem Relation Age of Onset  . Osteoarthritis Mother   . Rheum arthritis Mother   . Cancer Father        liver  . Heart disease Father   . Hyperlipidemia Father   . Hypertension Father   . Heart attack Father   . Other Father        varicose veins  . Hyperlipidemia Sister   . Healthy Son   . Healthy Daughter     SOCIAL HISTORY Social History  Tobacco Use  . Smoking status: Never Smoker  . Smokeless tobacco: Never Used  Vaping Use  . Vaping Use: Never used  Substance Use Topics  . Alcohol use: Yes    Alcohol/week: 1.0 standard drink    Types: 1 Glasses of wine per week    Comment: rarely  . Drug use: No         OPHTHALMIC EXAM:  Base Eye Exam    Visual Acuity (Snellen - Linear)      Right Left   Dist [place] 20/200 20/25 -1   Dist ph [place] 20/30 -1        Tonometry (Tonopen, 10:31 AM)      Right Left   Pressure 12 12       Pupils      Pupils Dark Light Shape React APD   Right PERRL 4 3 Round Minimal None   Left PERRL 4 3 Round Minimal None       Visual Fields (Counting fingers)      Left Right    Full Full       Neuro/Psych    Oriented x3: Yes   Mood/Affect: Normal       Dilation    Right eye: 1.0% Mydriacyl, 2.5% Phenylephrine @ 10:31 AM        Slit Lamp and Fundus Exam     External Exam      Right Left   External Normal Normal       Slit Lamp Exam      Right Left   Lids/Lashes Normal Normal   Conjunctiva/Sclera White and quiet White and quiet   Cornea Clear Clear   Anterior Chamber Deep and quiet Deep and quiet   Iris Round and reactive Round and reactive   Lens Posterior chamber intraocular lens Posterior chamber intraocular lens   Anterior Vitreous Normal Normal       Fundus Exam      Right Left   Posterior Vitreous Normal Normal   Disc Normal    C/D Ratio 0.45    Macula Intermediate age related macular degeneration, Soft drusen, no exudates, Retinal pigment epithelial mottling, no macular thickening           IMAGING AND PROCEDURES  Imaging and Procedures for 10/05/19  OCT, Retina - OU - Both Eyes       Right Eye Central Foveal Thickness: 268. Findings include retinal drusen , abnormal foveal contour, no IRF, no SRF.   Left Eye Central Foveal Thickness: 273. Findings include retinal drusen , abnormal foveal contour, no SRF, no IRF.   Notes OD, resolved subfoveal serous detachment, status post intravitreal Avastin at 8 weeks, repeat injection today and examination in 8 weeks       Intravitreal Injection, Pharmacologic Agent - OD - Right Eye       Time Out 10/05/2019. 11:29 AM. Confirmed correct patient, procedure, site, and patient consented.   Anesthesia Topical anesthesia was used. Anesthetic medications included Akten 3.5%.   Procedure Preparation included 10% betadine to eyelids, Ofloxacin . A 30 gauge needle was used.   Injection:  1.25 mg Bevacizumab (AVASTIN) SOLN   NDC: [address]-2, Lot: [id number]   Route: Intravitreal, Site: Right Eye, Waste: 0 mg  Post-op Post injection exam found visual acuity of at least counting fingers. The patient tolerated the procedure well. There were no complications. The patient received written and verbal post procedure care education.  ASSESSMENT/PLAN:  Exudative age-related macular degeneration of right eye with active choroidal neovascularization (HCC) OD with history of multiple recurrences of serous subfoveal detachment secondary to wet AMD.  Now controlled on intravitreal Avastin, examination.  Every 8 weeks.      ICD-10-CM   1. Exudative age-related macular degeneration of right eye with active choroidal neovascularization (HCC)  H35.3211 OCT, Retina - OU - Both Eyes    Intravitreal Injection, Pharmacologic Agent - OD - Right Eye    Bevacizumab (AVASTIN) SOLN 1.25 mg    1.OD, resolved subfoveal serous detachment, status post intravitreal Avastin at 8 weeks, repeat injection today and examination in 8 weeks  2.  3.  Ophthalmic Meds Ordered this visit:  Meds ordered this encounter  Medications  . Bevacizumab (AVASTIN) SOLN 1.25 mg       Return in about 8 weeks (around 11/30/2019) for AVASTIN OCT, OD.  There are no Patient Instructions on file for this visit.   Explained the diagnoses, plan, and follow up with the patient and they expressed understanding.  Patient expressed understanding of the importance of proper follow up care.   Clent Demark Kimbley Sprague M.D. Diseases & Surgery of the Retina and Vitreous Retina & Diabetic [place] 10/05/19     Abbreviations: M myopia (nearsighted); A astigmatism; H hyperopia (farsighted); P presbyopia; Mrx spectacle prescription;  CTL contact lenses; OD right eye; OS left eye; OU both eyes  XT exotropia; ET esotropia; PEK punctate epithelial keratitis; PEE punctate epithelial erosions; DES dry eye syndrome; MGD meibomian gland dysfunction; ATs artificial tears; PFAT's preservative free artificial tears; [place] nuclear sclerotic cataract; PSC posterior subcapsular cataract; ERM epi-retinal membrane; PVD posterior vitreous detachment; RD retinal detachment; DM diabetes mellitus; DR diabetic retinopathy; NPDR non-proliferative diabetic retinopathy; PDR proliferative diabetic  retinopathy; CSME clinically significant macular edema; DME diabetic macular edema; dbh dot blot hemorrhages; CWS cotton wool spot; POAG primary open angle glaucoma; C/D cup-to-disc ratio; HVF humphrey visual field; GVF goldmann visual field; OCT optical coherence tomography; IOP intraocular pressure; BRVO Branch retinal vein occlusion; CRVO central retinal vein occlusion; CRAO central retinal artery occlusion; BRAO branch retinal artery occlusion; RT retinal tear; SB scleral buckle; PPV pars plana vitrectomy; VH Vitreous hemorrhage; PRP panretinal laser photocoagulation; IVK intravitreal kenalog; VMT vitreomacular traction; MH Macular hole;  NVD neovascularization of the disc; NVE neovascularization elsewhere; AREDS age related eye disease study; ARMD age related macular degeneration; POAG primary open angle glaucoma; EBMD epithelial/anterior basement membrane dystrophy; ACIOL anterior chamber intraocular lens; IOL intraocular lens; PCIOL posterior chamber intraocular lens; Phaco/IOL phacoemulsification with intraocular lens placement; [place] photorefractive keratectomy; LASIK laser assisted in situ keratomileusis; HTN hypertension; DM diabetes mellitus; COPD chronic obstructive pulmonary disease

## 2019-11-30 ENCOUNTER — Other Ambulatory Visit: Payer: Self-pay

## 2019-11-30 ENCOUNTER — Encounter (INDEPENDENT_AMBULATORY_CARE_PROVIDER_SITE_OTHER): Payer: Self-pay | Admitting: Ophthalmology

## 2019-11-30 ENCOUNTER — Ambulatory Visit (INDEPENDENT_AMBULATORY_CARE_PROVIDER_SITE_OTHER): Payer: Medicare Other | Admitting: Ophthalmology

## 2019-11-30 DIAGNOSIS — H353211 Exudative age-related macular degeneration, right eye, with active choroidal neovascularization: Secondary | ICD-10-CM

## 2019-11-30 MED ORDER — BEVACIZUMAB CHEMO INJECTION 1.25MG/0.05ML SYRINGE FOR KALEIDOSCOPE
1.2500 mg | INTRAVITREAL | Status: AC | PRN
  Administered 2019-11-30: 1.25 mg via INTRAVITREAL

## 2019-11-30 NOTE — Progress Notes (Signed)
11/30/2019     CHIEF COMPLAINT Patient presents for Retina Follow Up   HISTORY OF PRESENT ILLNESS: Angela Collier is a 74 y.o. female who presents to the clinic today for:   HPI    Retina Follow Up    Patient presents with  Wet AMD.  In right eye.  This started 8 weeks ago.  Severity is mild.  Duration of 8 weeks.  Since onset it is stable.          Comments    8 Week AMD F/U OD, poss Avastin OD  Pt denies noticeable changes to [place] OU since last visit. Pt denies ocular pain, flashes of light, or floaters OU.         Last edited by Rockie Neighbours, [place] on 11/30/2019 10:43 AM. (History)      Referring physician: Mayra Neer, MD [address],  [place] [address]  HISTORICAL INFORMATION:   Selected notes from the MEDICAL RECORD NUMBER       CURRENT MEDICATIONS: Current Outpatient Medications (Ophthalmic Drugs)  Medication Sig  . carboxymethylcellulose (REFRESH TEARS) 0.5 % SOLN Place 1 drop into both eyes 3 (three) times daily as needed (for dry eyes.).   No current facility-administered medications for this visit. (Ophthalmic Drugs)   Current Outpatient Medications (Other)  Medication Sig  . CALCIUM PO Take by mouth.  . Coenzyme Q10 (CO Q 10 PO) Take by mouth.  . COLLAGEN PO Take by mouth.  Marland Kitchen MAGNESIUM PO Take by mouth.  . Multiple Vitamins-Minerals (PRESERVISION AREDS 2 PO) Take by mouth 2 (two) times daily.  . NON FORMULARY Take 1 Device by mouth at bedtime. EMA oral device for the treatment of macular degeneration  . Omega-3 Fatty Acids (FISH OIL PO) Take by mouth daily.  . Red Yeast Rice Extract (RED YEAST RICE PO) Take by mouth 2 (two) times daily.  . TURMERIC PO Take by mouth.  Marland Kitchen VITAMIN D PO Take by mouth 2 (two) times daily.  Marland Kitchen VITAMIN E PO Take by mouth.   No current facility-administered medications for this visit. (Other)      REVIEW OF SYSTEMS:    ALLERGIES Allergies  Allergen Reactions  . Sulfa Antibiotics  Itching  . Sulfasalazine Itching    PAST MEDICAL HISTORY Past Medical History:  Diagnosis Date  . Arthritis    large joint   . Hyperlipidemia   . Macular degeneration   . Osteopenia   . Osteoporosis   . Pneumonia    childhood age 53   . Varicose veins    Past Surgical History:  Procedure Laterality Date  . CATARACT EXTRACTION Right    2011  . FRACTURE SURGERY  2007   BIL wrist  . PARTIAL HYSTERECTOMY    . TOTAL KNEE ARTHROPLASTY Right 01/10/2018   Procedure: RIGHT TOTAL KNEE ARTHROPLASTY;  Surgeon: Gaynelle Arabian, MD;  Location: [facility];  Service: Orthopedics;  Laterality: Right;  . WRIST FRACTURE SURGERY Bilateral 2004    FAMILY HISTORY Family History  Problem Relation Age of Onset  . Osteoarthritis Mother   . Rheum arthritis Mother   . Cancer Father        liver  . Heart disease Father   . Hyperlipidemia Father   . Hypertension Father   . Heart attack Father   . Other Father        varicose veins  . Hyperlipidemia Sister   . Healthy Son   . Healthy Daughter  SOCIAL HISTORY Social History   Tobacco Use  . Smoking status: Never Smoker  . Smokeless tobacco: Never Used  Vaping Use  . Vaping Use: Never used  Substance Use Topics  . Alcohol use: Yes    Alcohol/week: 1.0 standard drink    Types: 1 Glasses of wine per week    Comment: rarely  . Drug use: No         OPHTHALMIC EXAM:  Base Eye Exam    Visual Acuity (ETDRS)      Right Left   Dist [place] 20/100 -2 20/25 +2   Dist ph [place] 20/30 -2        Tonometry (Tonopen, 10:47 AM)      Right Left   Pressure 14 14       Pupils      Pupils Dark Light Shape React APD   Right PERRL 4 3 Round Brisk None   Left PERRL 4 3 Round Brisk None       Visual Fields (Counting fingers)      Left Right    Full Full       Extraocular Movement      Right Left    Full Full       Neuro/Psych    Oriented x3: Yes   Mood/Affect: Normal       Dilation    Right eye: 1.0% Mydriacyl, 2.5% Phenylephrine @  10:47 AM        Slit Lamp and Fundus Exam    External Exam      Right Left   External Normal Normal       Slit Lamp Exam      Right Left   Lids/Lashes Normal Normal   Conjunctiva/Sclera White and quiet White and quiet   Cornea Clear Clear   Anterior Chamber Deep and quiet Deep and quiet   Iris Round and reactive Round and reactive   Lens Posterior chamber intraocular lens Posterior chamber intraocular lens   Anterior Vitreous Normal Normal       Fundus Exam      Right Left   Posterior Vitreous Normal Normal   Disc Normal    C/D Ratio 0.45    Macula Intermediate age related macular degeneration, Soft drusen, no exudates, Retinal pigment epithelial mottling, no macular thickening, Pigmented atrophy, no hemorrhage    Vessels Normal    Periphery Normal           IMAGING AND PROCEDURES  Imaging and Procedures for 11/30/19  OCT, Retina - OU - Both Eyes       Right Eye Quality was good. Scan locations included subfoveal. Central Foveal Thickness: 260. Progression has been stable. Findings include no SRF, no IRF.   Left Eye Quality was good. Scan locations included subfoveal. Central Foveal Thickness: 268. Findings include no IRF, no SRF, retinal drusen .   Notes OD at 8-week exam interval post Avastin for history of chronic recurrences of serous retinal detachment associated with ARMD, wet       Intravitreal Injection, Pharmacologic Agent - OD - Right Eye       Time Out 11/30/2019. 11:39 AM. Confirmed correct patient, procedure, site, and patient consented.   Anesthesia Topical anesthesia was used. Anesthetic medications included Akten 3.5%.   Procedure Preparation included Tobramycin 0.3%, 10% betadine to eyelids, 5% betadine to ocular surface. A supplied needle was used.   Injection:  1.25 mg Bevacizumab (AVASTIN) SOLN   NDC: [address]-7, Lot: [id number]   Route: Intravitreal, Site:  Right Eye, Waste: 0 mg  Post-op Post injection exam found visual acuity of  at least counting fingers. The patient tolerated the procedure well. There were no complications. The patient received written and verbal post procedure care education. Post injection medications were not given.                 ASSESSMENT/PLAN:  Exudative age-related macular degeneration of right eye with active choroidal neovascularization (HCC) OD with history of multiple recurrences of serous subfoveal detachment secondary to wet AMD.  Now controlled on intravitreal Avastin, examination.  Every 8 weeks.  Extend interval now right eye to 10 weeks      ICD-10-CM   1. Exudative age-related macular degeneration of right eye with active choroidal neovascularization (HCC)  H35.3211 OCT, Retina - OU - Both Eyes    Intravitreal Injection, Pharmacologic Agent - OD - Right Eye    Bevacizumab (AVASTIN) SOLN 1.25 mg    1.  OD with history of chronic recurrent serous retinal detachment associated with exudative ARMD, now stable at multiple 8-week interval examinations and therapy with intravitreal Avastin.  Repeat injection OD today and examination in 10 weeks OD  2.  10-week follow-up dilated OU  3.  Ophthalmic Meds Ordered this visit:  Meds ordered this encounter  Medications  . Bevacizumab (AVASTIN) SOLN 1.25 mg       Return in about 10 weeks (around 02/08/2020) for DILATE OU, AVASTIN OCT, OD.  There are no Patient Instructions on file for this visit.   Explained the diagnoses, plan, and follow up with the patient and they expressed understanding.  Patient expressed understanding of the importance of proper follow up care.   Clent Demark Venetia Prewitt M.D. Diseases & Surgery of the Retina and Vitreous Retina [facility] Diabetic [place] 11/30/19     Abbreviations: M myopia (nearsighted); A astigmatism; H hyperopia (farsighted); P presbyopia; Mrx spectacle prescription;  CTL contact lenses; OD right eye; OS left eye; OU both eyes  XT exotropia; ET esotropia; PEK punctate epithelial  keratitis; PEE punctate epithelial erosions; DES dry eye syndrome; MGD meibomian gland dysfunction; ATs artificial tears; PFAT's preservative free artificial tears; [place] nuclear sclerotic cataract; PSC posterior subcapsular cataract; ERM epi-retinal membrane; PVD posterior vitreous detachment; RD retinal detachment; DM diabetes mellitus; DR diabetic retinopathy; NPDR non-proliferative diabetic retinopathy; PDR proliferative diabetic retinopathy; CSME clinically significant macular edema; DME diabetic macular edema; dbh dot blot hemorrhages; CWS cotton wool spot; POAG primary open angle glaucoma; C/D cup-to-disc ratio; HVF humphrey visual field; GVF goldmann visual field; OCT optical coherence tomography; IOP intraocular pressure; BRVO Branch retinal vein occlusion; CRVO central retinal vein occlusion; CRAO central retinal artery occlusion; BRAO branch retinal artery occlusion; RT retinal tear; SB scleral buckle; PPV pars plana vitrectomy; VH Vitreous hemorrhage; PRP panretinal laser photocoagulation; IVK intravitreal kenalog; VMT vitreomacular traction; MH Macular hole;  NVD neovascularization of the disc; NVE neovascularization elsewhere; AREDS age related eye disease study; ARMD age related macular degeneration; POAG primary open angle glaucoma; EBMD epithelial/anterior basement membrane dystrophy; ACIOL anterior chamber intraocular lens; IOL intraocular lens; PCIOL posterior chamber intraocular lens; Phaco/IOL phacoemulsification with intraocular lens placement; [place] photorefractive keratectomy; LASIK laser assisted in situ keratomileusis; HTN hypertension; DM diabetes mellitus; COPD chronic obstructive pulmonary disease

## 2019-11-30 NOTE — Assessment & Plan Note (Signed)
OD with history of multiple recurrences of serous subfoveal detachment secondary to wet AMD.  Now controlled on intravitreal Avastin, examination.  Every 8 weeks.  Extend interval now right eye to 10 weeks

## 2020-02-08 ENCOUNTER — Other Ambulatory Visit: Payer: Self-pay

## 2020-02-08 ENCOUNTER — Encounter (INDEPENDENT_AMBULATORY_CARE_PROVIDER_SITE_OTHER): Payer: Self-pay | Admitting: Ophthalmology

## 2020-02-08 ENCOUNTER — Ambulatory Visit (INDEPENDENT_AMBULATORY_CARE_PROVIDER_SITE_OTHER): Payer: Medicare Other | Admitting: Ophthalmology

## 2020-02-08 DIAGNOSIS — H353122 Nonexudative age-related macular degeneration, left eye, intermediate dry stage: Secondary | ICD-10-CM

## 2020-02-08 DIAGNOSIS — H353211 Exudative age-related macular degeneration, right eye, with active choroidal neovascularization: Secondary | ICD-10-CM | POA: Diagnosis not present

## 2020-02-08 MED ORDER — BEVACIZUMAB CHEMO INJECTION 1.25MG/0.05ML SYRINGE FOR KALEIDOSCOPE
1.2500 mg | INTRAVITREAL | Status: AC | PRN
  Administered 2020-02-08: 1.25 mg via INTRAVITREAL

## 2020-02-08 NOTE — Progress Notes (Signed)
02/08/2020     CHIEF COMPLAINT Patient presents for Retina Follow Up   HISTORY OF PRESENT ILLNESS: Angela Collier is a 74 y.o. female who presents to the clinic today for:   HPI    Retina Follow Up    Patient presents with  Wet AMD.  In right eye.  Severity is moderate.  Duration of 10 weeks.  Since onset it is stable.  I, the attending physician,  performed the HPI with the patient and updated documentation appropriately.          Comments    10 Week f\u OU. Possible Avastin OD. OCT  Pt states no changes in vision. Denies any complaints.       Last edited by Tilda Franco on 02/08/2020  9:57 AM. (History)      Referring physician: Mayra Neer, MD [address],  [place] [address]  HISTORICAL INFORMATION:   Selected notes from the MEDICAL RECORD NUMBER       CURRENT MEDICATIONS: Current Outpatient Medications (Ophthalmic Drugs)  Medication Sig  . carboxymethylcellulose (REFRESH TEARS) 0.5 % SOLN Place 1 drop into both eyes 3 (three) times daily as needed (for dry eyes.).   No current facility-administered medications for this visit. (Ophthalmic Drugs)   Current Outpatient Medications (Other)  Medication Sig  . CALCIUM PO Take by mouth.  . Coenzyme Q10 (CO Q 10 PO) Take by mouth.  . COLLAGEN PO Take by mouth.  Marland Kitchen MAGNESIUM PO Take by mouth.  . Multiple Vitamins-Minerals (PRESERVISION AREDS 2 PO) Take by mouth 2 (two) times daily.  . NON FORMULARY Take 1 Device by mouth at bedtime. EMA oral device for the treatment of macular degeneration  . Omega-3 Fatty Acids (FISH OIL PO) Take by mouth daily.  . Red Yeast Rice Extract (RED YEAST RICE PO) Take by mouth 2 (two) times daily.  . TURMERIC PO Take by mouth.  Marland Kitchen VITAMIN D PO Take by mouth 2 (two) times daily.  Marland Kitchen VITAMIN E PO Take by mouth.   No current facility-administered medications for this visit. (Other)      REVIEW OF SYSTEMS:    ALLERGIES Allergies  Allergen Reactions   . Sulfa Antibiotics Itching  . Sulfasalazine Itching    PAST MEDICAL HISTORY Past Medical History:  Diagnosis Date  . Arthritis    large joint   . Hyperlipidemia   . Macular degeneration   . Osteopenia   . Osteoporosis   . Pneumonia    childhood age 21   . Varicose veins    Past Surgical History:  Procedure Laterality Date  . CATARACT EXTRACTION Right    2011  . FRACTURE SURGERY  2007   BIL wrist  . PARTIAL HYSTERECTOMY    . TOTAL KNEE ARTHROPLASTY Right 01/10/2018   Procedure: RIGHT TOTAL KNEE ARTHROPLASTY;  Surgeon: Gaynelle Arabian, MD;  Location: [facility];  Service: Orthopedics;  Laterality: Right;  . WRIST FRACTURE SURGERY Bilateral 2004    FAMILY HISTORY Family History  Problem Relation Age of Onset  . Osteoarthritis Mother   . Rheum arthritis Mother   . Cancer Father        liver  . Heart disease Father   . Hyperlipidemia Father   . Hypertension Father   . Heart attack Father   . Other Father        varicose veins  . Hyperlipidemia Sister   . Healthy Son   . Healthy Daughter     SOCIAL  HISTORY Social History   Tobacco Use  . Smoking status: Never Smoker  . Smokeless tobacco: Never Used  Vaping Use  . Vaping Use: Never used  Substance Use Topics  . Alcohol use: Yes    Alcohol/week: 1.0 standard drink    Types: 1 Glasses of wine per week    Comment: rarely  . Drug use: No         OPHTHALMIC EXAM:  Base Eye Exam    Visual Acuity (Snellen - Linear)      Right Left   Dist [place] 20/200 20/25 -2   Dist ph [place] 20/25 -1        Tonometry (Tonopen, 10:01 AM)      Right Left   Pressure 13 11       Pupils      Pupils Dark Light Shape React APD   Right PERRL 4 3 Round Brisk None   Left PERRL 4 3 Round Brisk None       Visual Fields (Counting fingers)      Left Right    Full Full       Neuro/Psych    Oriented x3: Yes   Mood/Affect: Normal       Dilation    Both eyes: 1.0% Mydriacyl, 2.5% Phenylephrine @ 10:01 AM        Slit Lamp and  Fundus Exam    External Exam      Right Left   External Normal Normal       Slit Lamp Exam      Right Left   Lids/Lashes Normal Normal   Conjunctiva/Sclera White and quiet White and quiet   Cornea Clear Clear   Anterior Chamber Deep and quiet Deep and quiet   Iris Round and reactive Round and reactive   Lens Posterior chamber intraocular lens Posterior chamber intraocular lens   Anterior Vitreous Normal Normal       Fundus Exam      Right Left   Posterior Vitreous Normal Normal   Disc Normal Normal   C/D Ratio 0.45 0.45   Macula Intermediate age related macular degeneration, Soft drusen, no exudates, Retinal pigment epithelial mottling, no macular thickening, Pigmented atrophy, no hemorrhage Intermediate age related macular degeneration, Soft drusen, no exudates, Retinal pigment epithelial mottling, no macular thickening, Pigmented atrophy, no hemorrhage   Vessels Normal Normal   Periphery Normal Normal          IMAGING AND PROCEDURES  Imaging and Procedures for 02/08/20  OCT, Retina - OU - Both Eyes       Right Eye Quality was good. Scan locations included subfoveal. Central Foveal Thickness: 260. Progression has improved. Findings include no SRF, no IRF, abnormal foveal contour, retinal drusen .   Left Eye Quality was good. Scan locations included subfoveal. Central Foveal Thickness: 268. Progression has improved. Findings include no IRF, no SRF, abnormal foveal contour, retinal drusen .   Notes Previous wet ARMD OD currently remaining stable at 10-week interval, will repeat injection today and reevaluate OD and OS again in 3 months       Intravitreal Injection, Pharmacologic Agent - OD - Right Eye       Time Out 02/08/2020. 11:08 AM. Confirmed correct patient, procedure, site, and patient consented.   Anesthesia Topical anesthesia was used. Anesthetic medications included Akten 3.5%.   Procedure Preparation included Ofloxacin , 5% betadine to ocular  surface, 10% betadine to eyelids. A 30 gauge needle was used.   Injection:  1.25  mg Bevacizumab (AVASTIN) SOLN   NDC: 70360-001-02, Lot: [id number]   Route: Intravitreal, Site: Right Eye, Waste: 0 mg  Post-op Post injection exam found visual acuity of at least counting fingers. The patient tolerated the procedure well. There were no complications. The patient received written and verbal post procedure care education. Post injection medications were not given.                 ASSESSMENT/PLAN:  Exudative age-related macular degeneration of right eye with active choroidal neovascularization (HCC) CNVM OD has, under control on intravitreal Avastin currently at 10-week follow-up.  Repeat injection today and recurrences and evaluation again repeated in 3 months  Intermediate stage nonexudative age-related macular degeneration of left eye Patient to continue to monitor looking for new onset visual acuity declines or distortions      ICD-10-CM   1. Exudative age-related macular degeneration of right eye with active choroidal neovascularization (HCC)  H35.3211 OCT, Retina - OU - Both Eyes    Intravitreal Injection, Pharmacologic Agent - OD - Right Eye    Bevacizumab (AVASTIN) SOLN 1.25 mg  2. Intermediate stage nonexudative age-related macular degeneration of left eye  H35.3122     1.  Repeat intravitreal Avastin OD today due to history of multiple recurrences of to maintain and stabilize macular condition.  Reevaluate each eye in 3 months and possible injection Avastin at that time OD  2.  3.  Ophthalmic Meds Ordered this visit:  Meds ordered this encounter  Medications  . Bevacizumab (AVASTIN) SOLN 1.25 mg       Return in about 3 months (around 05/10/2020) for DILATE OU, OCT, AVASTIN OCT, OD.  There are no Patient Instructions on file for this visit.   Explained the diagnoses, plan, and follow up with the patient and they expressed understanding.  Patient expressed  understanding of the importance of proper follow up care.   Clent Demark Sharman Garrott M.D. Diseases & Surgery of the Retina and Vitreous Retina & Diabetic [place] 02/08/20     Abbreviations: M myopia (nearsighted); A astigmatism; H hyperopia (farsighted); P presbyopia; Mrx spectacle prescription;  CTL contact lenses; OD right eye; OS left eye; OU both eyes  XT exotropia; ET esotropia; PEK punctate epithelial keratitis; PEE punctate epithelial erosions; DES dry eye syndrome; MGD meibomian gland dysfunction; ATs artificial tears; PFAT's preservative free artificial tears; [place] nuclear sclerotic cataract; PSC posterior subcapsular cataract; ERM epi-retinal membrane; PVD posterior vitreous detachment; RD retinal detachment; DM diabetes mellitus; DR diabetic retinopathy; NPDR non-proliferative diabetic retinopathy; PDR proliferative diabetic retinopathy; CSME clinically significant macular edema; DME diabetic macular edema; dbh dot blot hemorrhages; CWS cotton wool spot; POAG primary open angle glaucoma; C/D cup-to-disc ratio; HVF humphrey visual field; GVF goldmann visual field; OCT optical coherence tomography; IOP intraocular pressure; BRVO Branch retinal vein occlusion; CRVO central retinal vein occlusion; CRAO central retinal artery occlusion; BRAO branch retinal artery occlusion; RT retinal tear; SB scleral buckle; PPV pars plana vitrectomy; VH Vitreous hemorrhage; PRP panretinal laser photocoagulation; IVK intravitreal kenalog; VMT vitreomacular traction; MH Macular hole;  NVD neovascularization of the disc; NVE neovascularization elsewhere; AREDS age related eye disease study; ARMD age related macular degeneration; POAG primary open angle glaucoma; EBMD epithelial/anterior basement membrane dystrophy; ACIOL anterior chamber intraocular lens; IOL intraocular lens; PCIOL posterior chamber intraocular lens; Phaco/IOL phacoemulsification with intraocular lens placement; [place] photorefractive keratectomy; LASIK laser  assisted in situ keratomileusis; HTN hypertension; DM diabetes mellitus; COPD chronic obstructive pulmonary disease

## 2020-02-08 NOTE — Assessment & Plan Note (Signed)
Patient to continue to monitor looking for new onset visual acuity declines or distortions

## 2020-02-08 NOTE — Assessment & Plan Note (Signed)
CNVM OD has, under control on intravitreal Avastin currently at 10-week follow-up.  Repeat injection today and recurrences and evaluation again repeated in 3 months

## 2020-03-15 ENCOUNTER — Other Ambulatory Visit: Payer: Self-pay | Admitting: Physician Assistant

## 2020-03-15 ENCOUNTER — Other Ambulatory Visit (HOSPITAL_COMMUNITY): Payer: Self-pay | Admitting: Physician Assistant

## 2020-03-15 DIAGNOSIS — Z96651 Presence of right artificial knee joint: Secondary | ICD-10-CM

## 2020-04-11 ENCOUNTER — Other Ambulatory Visit: Payer: Self-pay

## 2020-04-11 ENCOUNTER — Encounter (HOSPITAL_COMMUNITY)
Admission: RE | Admit: 2020-04-11 | Discharge: 2020-04-11 | Disposition: A | Discharge status: Home / Self Care | Payer: Medicare Other | Source: Ambulatory Visit | Attending: Physician Assistant | Admitting: Physician Assistant

## 2020-04-11 DIAGNOSIS — Z96651 Presence of right artificial knee joint: Secondary | ICD-10-CM | POA: Insufficient documentation

## 2020-04-11 MED ORDER — TECHNETIUM TC 99M MEDRONATE IV KIT
20.0000 | PACK | Freq: Once | INTRAVENOUS | Status: AC | PRN
  Administered 2020-04-11: 20 via INTRAVENOUS

## 2020-05-03 DIAGNOSIS — Z96651 Presence of right artificial knee joint: Secondary | ICD-10-CM | POA: Diagnosis not present

## 2020-05-09 ENCOUNTER — Ambulatory Visit: Payer: Medicare Other | Attending: Orthopedic Surgery | Admitting: Physical Therapy

## 2020-05-09 ENCOUNTER — Encounter: Payer: Self-pay | Admitting: Physical Therapy

## 2020-05-09 ENCOUNTER — Other Ambulatory Visit: Payer: Self-pay

## 2020-05-09 DIAGNOSIS — M6281 Muscle weakness (generalized): Secondary | ICD-10-CM | POA: Diagnosis not present

## 2020-05-09 DIAGNOSIS — R600 Localized edema: Secondary | ICD-10-CM | POA: Insufficient documentation

## 2020-05-09 DIAGNOSIS — M25561 Pain in right knee: Secondary | ICD-10-CM | POA: Diagnosis not present

## 2020-05-09 DIAGNOSIS — G8929 Other chronic pain: Secondary | ICD-10-CM | POA: Insufficient documentation

## 2020-05-09 DIAGNOSIS — M25661 Stiffness of right knee, not elsewhere classified: Secondary | ICD-10-CM | POA: Diagnosis not present

## 2020-05-09 NOTE — Patient Instructions (Signed)
Access Code: [email or web address] URL: [email or web address] Date: 05/09/2020 Prepared by: Amador Cunas  Exercises Clamshell with Resistance - 1 x daily - 4 x weekly - 3 sets - 10 reps Supine Active Straight Leg Raise - 1 x daily - 4 x weekly - 3 sets - 10 reps Straight Leg Raise with External Rotation - 1 x daily - 4 x weekly - 3 sets - 10 reps Standing Knee Flexion with Counter Support - 1 x daily - 4 x weekly - 3 sets - 10 reps Standing Hip Abduction with Counter Support - 1 x daily - 4 x weekly - 3 sets - 10 reps Standing Hip Extension with Counter Support - 1 x daily - 4 x weekly - 3 sets - 10 reps Seated Knee Flexion Stretch - 1 x daily - 4 x weekly - 3 sets - 5 reps - 10-15 hold

## 2020-05-09 NOTE — Therapy (Signed)
[place] [place], [place], [address] Phone: [phone]   Fax:  [phone]  Physical Therapy Evaluation  Patient Details  Name: Angela Collier MRN: [id number] Date of Birth: August 05, 1945 Referring Provider (PT): Aluisio   Encounter Date: 05/09/2020   PT End of Session - 05/09/20 1527    Visit Number 1    Date for PT Re-Evaluation 07/07/20    PT Start Time 1401    PT Stop Time 1446    PT Time Calculation (min) 45 min    Activity Tolerance Patient tolerated treatment well    Behavior During Therapy [facility] for tasks assessed/performed           Past Medical History:  Diagnosis Date  . Arthritis    large joint   . Hyperlipidemia   . Macular degeneration   . Osteopenia   . Osteoporosis   . Pneumonia    childhood age 20   . Varicose veins     Past Surgical History:  Procedure Laterality Date  . CATARACT EXTRACTION Right    2011  . FRACTURE SURGERY  2007   BIL wrist  . PARTIAL HYSTERECTOMY    . TOTAL KNEE ARTHROPLASTY Right 01/10/2018   Procedure: RIGHT TOTAL KNEE ARTHROPLASTY;  Surgeon: Gaynelle Arabian, MD;  Location: [facility];  Service: Orthopedics;  Laterality: Right;  . WRIST FRACTURE SURGERY Bilateral 2004    There were no vitals filed for this visit.    Subjective Assessment - 05/09/20 1357    Subjective Pt had R TKA 2019. Pt states she has history of lateral tracking B patella with subluxations, no surgeries for this. Pt is active at baseline doing yoga/teaching yoga, hx of dancing, and teaching water exercise. Pt has been having R knee pain increasing since last summer 2021. Pt had fluid aspirated from R knee 11/2019. Pt had xray and MRI; clear of any acute abnormalities or structural problems with surgery. Pt states that around 03/2020 she started feeling unstable on R knee, felt like knee was hyperextending at times and had catching sensation at other times. Pt reports a fall on R knee around the  holidays; imaging post fall found no acute abnormalities but pt is still having increased pain and "catching" sensation.    Pertinent History R TKA 2019    Diagnostic tests xrays, MRI R knee    Currently in Pain? No/denies    Pain Score 0-No pain    Pain Location Knee    Pain Orientation Right    Pain Descriptors / Indicators Discomfort;Other (Comment);Aching;Dull   "catching sensation"   Pain Type Acute pain    Pain Onset More than a month ago    Pain Frequency Intermittent    Aggravating Factors  prolonged walking, bending, prolonged standing    Pain Relieving Factors rest              [facility] PT Assessment - 05/09/20 0001      Assessment   Medical Diagnosis R knee pain    Referring Provider (PT) Aluisio    Onset Date/Surgical Date --   Aug 2021   Prior Therapy PT post R TKA 2019      Precautions   Precautions None      Restrictions   Weight Bearing Restrictions No      Balance Screen   Has the patient fallen in the past 6 months Yes    How many times? 1    Has the patient had a  decrease in activity level because of a fear of falling?  No    Is the patient reluctant to leave their home because of a fear of falling?  No      Home Environment   Additional Comments 3 floors; reports occasional trouble with stairs      Prior Function   Level of Independence Independent    Vocation Retired    Leisure yoga, teaches yoga, water aerobics, walking      Observation/Other Assessments-Edema    Edema Circumferential      Circumferential Edema   Circumferential - Right 44 cm mid patella    Circumferential - Left  40 cm mid patella      Sensation   Light Touch Appears Intact      Functional Tests   Functional tests Sit to Stand      Sit to Stand   Comments WFL      ROM / Strength   AROM / PROM / Strength AROM;Strength      AROM   AROM Assessment Site Knee    Right/Left Knee Right    Right Knee Extension -2    Right Knee Flexion 110      Strength   Overall  Strength Comments BLE 5/5 except hip abd/ext 4-/5      Flexibility   Soft Tissue Assessment /Muscle Length yes    Hamstrings WFL    Quadriceps tight    ITB WFL    Piriformis WFL      Palpation   Palpation comment mild tenderness to palpation posterior knee around R proximal gastroc and distal hamstring      Transfers   Five time sit to stand comments  WFL                      Objective measurements completed on examination: See above findings.               PT Education - 05/09/20 1527    Education Details Pt educated on POC and HEP    Person(s) Educated Patient    Methods Explanation;Demonstration;Handout    Comprehension Verbalized understanding;Returned demonstration            PT Short Term Goals - 05/09/20 1649      PT SHORT TERM GOAL #1   Title Pt will be I with initial HEP    Time 2    Period Weeks    Status New    Target Date 05/23/20             PT Long Term Goals - 05/09/20 1649      PT LONG TERM GOAL #1   Title Pt will be I with advanced HEP    Time 6    Period Weeks    Status New    Target Date 06/20/20      PT LONG TERM GOAL #2   Title Pt will demo R knee circumference </=2 cm diff from L knee    Baseline R knee 44 cm mid patella, L knee 40 cm    Time 6    Period Weeks    Status New    Target Date 06/20/20      PT LONG TERM GOAL #3   Title Pt will demo R knee flexion ROM 120    Time 6    Period Weeks    Status New    Target Date 06/20/20      PT LONG TERM GOAL #  4   Title Pt will report resolution of feelings of R knee instability    Time 6    Period Weeks    Status New    Target Date 06/20/20                  Plan - 05/09/20 1644    Clinical Impression Statement Pt presents to clinic with reports of R knee instability, pain, and swelling present since Aug 2021 and worsening over the past few months. Pt has PMH of R TKA in 2019. States that she did well post surgery and was able to attain R knee  flexion ~120 deg but at the beginning of the Covid pandemic her activity level steeply dropped and she began walking more. Pt demos good stability/balance but some quad and hip weakness along with deficits in knee ROM. Pt demos increased swelling in R knee as compared to L. Pt is very active as PLOF and would like to maintain this level of activity. Added quad and hip strengthening ex's to pt's current exercise routine. Pt would benefit from skilled PT to address the above impairments.    Personal Factors and Comorbidities Comorbidity 1    Comorbidities osteoporosis    Examination-Activity Limitations Stand;Locomotion Level    Examination-Participation Restrictions Community Activity;Interpersonal Relationship    Stability/Clinical Decision Making Stable/Uncomplicated    Clinical Decision Making Low    Rehab Potential Good    PT Frequency 2x / week    PT Duration 6 weeks    PT Treatment/Interventions ADLs/Self Care Home Management;Cryotherapy;Electrical Stimulation;Iontophoresis 4mg /ml Dexamethasone;Gait training;Stair training;Therapeutic activities;Therapeutic exercise;Neuromuscular re-education;Manual techniques;Patient/family education;Passive range of motion;Vasopneumatic Device    PT Next Visit Plan knee ROM, knee/LE strength/stabilization, manual/modalities as indicated    PT Home Exercise Plan see pt instructions    Consulted and Agree with Plan of Care Patient           Patient will benefit from skilled therapeutic intervention in order to improve the following deficits and impairments:  Abnormal gait,Decreased range of motion,Difficulty walking,Decreased activity tolerance,Pain,Hypomobility,Impaired flexibility,Decreased strength,Increased edema  Visit Diagnosis: Chronic pain of right knee  Stiffness of right knee, not elsewhere classified  Localized edema  Muscle weakness (generalized)     Problem List Patient Active Problem List   Diagnosis Date Noted  . Intermediate  stage nonexudative age-related macular degeneration of left eye 02/08/2020  . Exudative age-related macular degeneration of right eye with active choroidal neovascularization ([place]) 08/10/2019  . Intermediate stage nonexudative age-related macular degeneration of right eye 08/10/2019  . Serous detachment of retinal pigment epithelium of right eye 08/10/2019  . Degenerative retinal drusen of both eyes 08/10/2019  . Pseudophakia 08/10/2019  . Osteoarthritis of right knee 01/12/2018  . OA (osteoarthritis) of knee 01/10/2018  . Age-related osteoporosis without current pathological fracture 08/12/2016  . Osteoarthritis of both knees 02/24/2016  . Knee effusion, right 02/24/2016  . Patellar luxation 02/24/2016  . Osteoarthritis of both feet 02/24/2016  . Osteoarthritis of both hands 02/24/2016  . Macular degeneration 02/24/2016  . Varicose veins of lower extremities with other complications 51/88/4166   Amador Cunas, PT, DPT [facility] [employer] 05/09/2020, 4:52 PM  [place]. [place], [place], [address] Phone: [phone]   Fax:  [phone]  Name: Angela Collier MRN: [id number] Date of Birth: 25-Jan-1946

## 2020-05-13 ENCOUNTER — Encounter (INDEPENDENT_AMBULATORY_CARE_PROVIDER_SITE_OTHER): Payer: Medicare Other | Admitting: Ophthalmology

## 2020-05-14 ENCOUNTER — Encounter: Payer: Self-pay | Admitting: Physical Therapy

## 2020-05-14 ENCOUNTER — Other Ambulatory Visit: Payer: Self-pay

## 2020-05-14 ENCOUNTER — Ambulatory Visit: Payer: Medicare Other | Attending: Orthopedic Surgery | Admitting: Physical Therapy

## 2020-05-14 DIAGNOSIS — M25661 Stiffness of right knee, not elsewhere classified: Secondary | ICD-10-CM | POA: Diagnosis not present

## 2020-05-14 DIAGNOSIS — G8929 Other chronic pain: Secondary | ICD-10-CM | POA: Insufficient documentation

## 2020-05-14 DIAGNOSIS — M6281 Muscle weakness (generalized): Secondary | ICD-10-CM | POA: Insufficient documentation

## 2020-05-14 DIAGNOSIS — M25561 Pain in right knee: Secondary | ICD-10-CM | POA: Diagnosis not present

## 2020-05-14 DIAGNOSIS — R600 Localized edema: Secondary | ICD-10-CM | POA: Diagnosis not present

## 2020-05-14 NOTE — Therapy (Signed)
[place]. [place], [place], [address] Phone: [phone]   Fax:  [phone]  Physical Therapy Treatment  Patient Details  Name: Angela Collier MRN: [id number] Date of Birth: 1945/06/30 Referring Provider (PT): Aluisio   Encounter Date: 05/14/2020   PT End of Session - 05/14/20 1106    Visit Number 2    Date for PT Re-Evaluation 07/07/20    PT Start Time 1018    PT Stop Time 1110    PT Time Calculation (min) 52 min    Activity Tolerance Patient tolerated treatment well    Behavior During Therapy [facility] for tasks assessed/performed           Past Medical History:  Diagnosis Date  . Arthritis    large joint   . Hyperlipidemia   . Macular degeneration   . Osteopenia   . Osteoporosis   . Pneumonia    childhood age 24   . Varicose veins     Past Surgical History:  Procedure Laterality Date  . CATARACT EXTRACTION Right    2011  . FRACTURE SURGERY  2007   BIL wrist  . PARTIAL HYSTERECTOMY    . TOTAL KNEE ARTHROPLASTY Right 01/10/2018   Procedure: RIGHT TOTAL KNEE ARTHROPLASTY;  Surgeon: Gaynelle Arabian, MD;  Location: [facility];  Service: Orthopedics;  Laterality: Right;  . WRIST FRACTURE SURGERY Bilateral 2004    There were no vitals filed for this visit.   Subjective Assessment - 05/14/20 1024    Subjective Pt reports that she is experiencing more discomfort and instability in R knee.    Currently in Pain? Yes    Pain Score 5     Pain Location Knee    Pain Orientation Right                             [facility] Adult PT Treatment/Exercise - 05/14/20 0001      Exercises   Exercises Knee/Hip      Knee/Hip Exercises: Stretches   Gastroc Stretch Both;1 rep;30 seconds      Knee/Hip Exercises: Aerobic   Nustep L5 x 7 min      Knee/Hip Exercises: Machines for Strengthening   Cybex Knee Extension 5# 1x10 BLE    Cybex Knee Flexion 20# 2x10 BLE      Knee/Hip Exercises: Standing    Heel Raises Both;1 set;15 reps    Hip Abduction 1 set;Both;10 reps    Abduction Limitations 2.5#    Hip Extension Both;1 set;10 reps    Extension Limitations 2.5#      Knee/Hip Exercises: Seated   Long Arc Quad Both;1 set;10 reps    Long Arc Quad Limitations 2.5#    Heel Slides Right;1 set;5 reps    Marching Both;1 set;10 reps    Marching Limitations 2.5#      Modalities   Modalities Vasopneumatic      Vasopneumatic   Number Minutes Vasopneumatic  15 minutes    Vasopnuematic Location  Knee    Vasopneumatic Pressure Medium    Vasopneumatic Temperature  34                    PT Short Term Goals - 05/09/20 1649      PT SHORT TERM GOAL #1   Title Pt will be I with initial HEP    Time 2    Period Weeks    Status New  Target Date 05/23/20             PT Long Term Goals - 05/09/20 1649      PT LONG TERM GOAL #1   Title Pt will be I with advanced HEP    Time 6    Period Weeks    Status New    Target Date 06/20/20      PT LONG TERM GOAL #2   Title Pt will demo R knee circumference </=2 cm diff from L knee    Baseline R knee 44 cm mid patella, L knee 40 cm    Time 6    Period Weeks    Status New    Target Date 06/20/20      PT LONG TERM GOAL #3   Title Pt will demo R knee flexion ROM 120    Time 6    Period Weeks    Status New    Target Date 06/20/20      PT LONG TERM GOAL #4   Title Pt will report resolution of feelings of R knee instability    Time 6    Period Weeks    Status New    Target Date 06/20/20                 Plan - 05/14/20 1107    Clinical Impression Statement Pt tolerated progression to TE well. Reported some catching sensation and pain in proximal gastroc/distal hamstring with seated HS curls. Difficulty with full knee extension on machine; LAQ instead. Cues with standing hip ex's to avoid hyperextension of R knee. Reviewed HEP with pt VU. Vaso for edema at end of session.    PT Treatment/Interventions ADLs/Self Care  Home Management;Cryotherapy;Electrical Stimulation;Iontophoresis 4mg /ml Dexamethasone;Gait training;Stair training;Therapeutic activities;Therapeutic exercise;Neuromuscular re-education;Manual techniques;Patient/family education;Passive range of motion;Vasopneumatic Device    PT Next Visit Plan knee ROM, knee/LE strength/stabilization, manual/modalities as indicated    Consulted and Agree with Plan of Care Patient           Patient will benefit from skilled therapeutic intervention in order to improve the following deficits and impairments:  Abnormal gait,Decreased range of motion,Difficulty walking,Decreased activity tolerance,Pain,Hypomobility,Impaired flexibility,Decreased strength,Increased edema  Visit Diagnosis: Chronic pain of right knee  Stiffness of right knee, not elsewhere classified  Localized edema  Muscle weakness (generalized)     Problem List Patient Active Problem List   Diagnosis Date Noted  . Intermediate stage nonexudative age-related macular degeneration of left eye 02/08/2020  . Exudative age-related macular degeneration of right eye with active choroidal neovascularization ([place]) 08/10/2019  . Intermediate stage nonexudative age-related macular degeneration of right eye 08/10/2019  . Serous detachment of retinal pigment epithelium of right eye 08/10/2019  . Degenerative retinal drusen of both eyes 08/10/2019  . Pseudophakia 08/10/2019  . Osteoarthritis of right knee 01/12/2018  . OA (osteoarthritis) of knee 01/10/2018  . Age-related osteoporosis without current pathological fracture 08/12/2016  . Osteoarthritis of both knees 02/24/2016  . Knee effusion, right 02/24/2016  . Patellar luxation 02/24/2016  . Osteoarthritis of both feet 02/24/2016  . Osteoarthritis of both hands 02/24/2016  . Macular degeneration 02/24/2016  . Varicose veins of lower extremities with other complications 16/96/7893   Amador Cunas, PT, DPT [facility] 05/14/2020, 11:09  AM  [address]. [place], [place], [address] Phone: [phone]   Fax:  [phone]  Name: Angela Collier MRN: [id number] Date of Birth: 1945/12/06

## 2020-05-16 ENCOUNTER — Ambulatory Visit (INDEPENDENT_AMBULATORY_CARE_PROVIDER_SITE_OTHER): Payer: Medicare Other | Admitting: Ophthalmology

## 2020-05-16 ENCOUNTER — Encounter (INDEPENDENT_AMBULATORY_CARE_PROVIDER_SITE_OTHER): Payer: Self-pay | Admitting: Ophthalmology

## 2020-05-16 ENCOUNTER — Other Ambulatory Visit: Payer: Self-pay

## 2020-05-16 DIAGNOSIS — H35721 Serous detachment of retinal pigment epithelium, right eye: Secondary | ICD-10-CM | POA: Diagnosis not present

## 2020-05-16 DIAGNOSIS — H353211 Exudative age-related macular degeneration, right eye, with active choroidal neovascularization: Secondary | ICD-10-CM | POA: Diagnosis not present

## 2020-05-16 MED ORDER — BEVACIZUMAB 2.5 MG/0.1ML IZ SOSY
2.5000 mg | PREFILLED_SYRINGE | INTRAVITREAL | Status: AC | PRN
  Administered 2020-05-16: 2.5 mg via INTRAVITREAL

## 2020-05-16 NOTE — Progress Notes (Signed)
05/16/2020     CHIEF COMPLAINT Patient presents for Retina Follow Up (3 Month Wet AMD f\u. Possible Avastin OD. OCT/Pt states vision has been stable. Denies new complaints.)   HISTORY OF PRESENT ILLNESS: Angela Collier is a 75 y.o. female who presents to the clinic today for:   HPI    Retina Follow Up    Patient presents with  Wet AMD.  In right eye.  Severity is moderate.  Duration of 3 months.  Since onset it is stable.  I, the attending physician,  performed the HPI with the patient and updated documentation appropriately. Additional comments: 3 Month Wet AMD f\u. Possible Avastin OD. OCT Pt states vision has been stable. Denies new complaints.       Last edited by Elyse Jarvislayton, Kriston M on 05/16/2020 10:50 AM. (History)      Referring physician: Lupita RaiderShaw, Kimberlee, MD [address] [place] [id number]  HISTORICAL INFORMATION:   Selected notes from the MEDICAL RECORD NUMBER       CURRENT MEDICATIONS: Current Outpatient Medications (Ophthalmic Drugs)  Medication Sig  . carboxymethylcellulose (REFRESH TEARS) 0.5 % SOLN Place 1 drop into both eyes 3 (three) times daily as needed (for dry eyes.).   No current facility-administered medications for this visit. (Ophthalmic Drugs)   Current Outpatient Medications (Other)  Medication Sig  . CALCIUM PO Take by mouth.  . Coenzyme Q10 (CO Q 10 PO) Take by mouth.  . COLLAGEN PO Take by mouth.  Marland Kitchen. MAGNESIUM PO Take by mouth.  . Multiple Vitamins-Minerals (PRESERVISION AREDS 2 PO) Take by mouth 2 (two) times daily.  . NON FORMULARY Take 1 Device by mouth at bedtime. EMA oral device for the treatment of macular degeneration  . Omega-3 Fatty Acids (FISH OIL PO) Take by mouth daily.  . Red Yeast Rice Extract (RED YEAST RICE PO) Take by mouth 2 (two) times daily.  . TURMERIC PO Take by mouth.  Marland Kitchen. VITAMIN D PO Take by mouth 2 (two) times daily.  Marland Kitchen. VITAMIN E PO Take by mouth.   No current facility-administered medications  for this visit. (Other)      REVIEW OF SYSTEMS:    ALLERGIES Allergies  Allergen Reactions  . Sulfa Antibiotics Itching  . Sulfasalazine Itching    PAST MEDICAL HISTORY Past Medical History:  Diagnosis Date  . Arthritis    large joint   . Hyperlipidemia   . Macular degeneration   . Osteopenia   . Osteoporosis   . Pneumonia    childhood age 7   . Varicose veins    Past Surgical History:  Procedure Laterality Date  . CATARACT EXTRACTION Right    2011  . FRACTURE SURGERY  2007   BIL wrist  . PARTIAL HYSTERECTOMY    . TOTAL KNEE ARTHROPLASTY Right 01/10/2018   Procedure: RIGHT TOTAL KNEE ARTHROPLASTY;  Surgeon: Ollen GrossAluisio, Frank, MD;  Location: [facility];  Service: Orthopedics;  Laterality: Right;  . WRIST FRACTURE SURGERY Bilateral 2004    FAMILY HISTORY Family History  Problem Relation Age of Onset  . Osteoarthritis Mother   . Rheum arthritis Mother   . Cancer Father        liver  . Heart disease Father   . Hyperlipidemia Father   . Hypertension Father   . Heart attack Father   . Other Father        varicose veins  . Hyperlipidemia Sister   . Healthy Son   . Healthy Daughter  SOCIAL HISTORY Social History   Tobacco Use  . Smoking status: Never Smoker  . Smokeless tobacco: Never Used  Vaping Use  . Vaping Use: Never used  Substance Use Topics  . Alcohol use: Yes    Alcohol/week: 1.0 standard drink    Types: 1 Glasses of wine per week    Comment: rarely  . Drug use: No         OPHTHALMIC EXAM: Base Eye Exam    Visual Acuity (Snellen - Linear)      Right Left   Dist [place] 20/400 20/20 -2   Dist ph [place] 20/40 -2        Tonometry (Tonopen, 10:56 AM)      Right Left   Pressure 13 13       Pupils      Pupils Dark Light Shape React APD   Right PERRL 4 3 Round Brisk None   Left PERRL 4 3 Round Brisk None       Visual Fields (Counting fingers)      Left Right    Full Full       Neuro/Psych    Oriented x3: Yes   Mood/Affect: Normal        Dilation    Both eyes: 1.0% Mydriacyl, 2.5% Phenylephrine @ 10:56 AM        Slit Lamp and Fundus Exam    External Exam      Right Left   External Normal Normal       Slit Lamp Exam      Right Left   Lids/Lashes Normal Normal   Conjunctiva/Sclera White and quiet White and quiet   Cornea Clear Clear   Anterior Chamber Deep and quiet Deep and quiet   Iris Round and reactive Round and reactive   Lens Posterior chamber intraocular lens Posterior chamber intraocular lens   Anterior Vitreous Normal Normal       Fundus Exam      Right Left   Posterior Vitreous Normal Normal   Disc Normal Normal   C/D Ratio 0.45 0.45   Macula Intermediate age related macular degeneration, Soft drusen, no exudates, Retinal pigment epithelial mottling, no macular thickening, Pigmented atrophy, no hemorrhage Intermediate age related macular degeneration, Soft drusen, no exudates, Retinal pigment epithelial mottling, no macular thickening, Pigmented atrophy, no hemorrhage   Vessels Normal Normal   Periphery Normal Normal          IMAGING AND PROCEDURES  Imaging and Procedures for 05/16/20  OCT, Retina - OU - Both Eyes       Right Eye Quality was good. Scan locations included subfoveal. Central Foveal Thickness: 255.   Left Eye Quality was good. Scan locations included subfoveal. Central Foveal Thickness: 268.        Intravitreal Injection, Pharmacologic Agent - OD - Right Eye       Time Out 05/16/2020. 11:47 AM. Confirmed correct patient, procedure, site, and patient consented.   Anesthesia Topical anesthesia was used. Anesthetic medications included Akten 3.5%.   Procedure Preparation included 5% betadine to ocular surface, 10% betadine to eyelids, Tobramycin 0.3%. A 30 gauge needle was used.   Injection:  2.5 mg Bevacizumab (AVASTIN) 2.5mg /0.73mL SOSY   NDC: 16967-893-81, Lot: [id number]   Route: Intravitreal, Site: Right Eye  Post-op Post injection exam found visual acuity  of at least counting fingers. The patient tolerated the procedure well. There were no complications. The patient received written and verbal post procedure care education. Post injection medications were  not given.                 ASSESSMENT/PLAN:  Serous detachment of retinal pigment epithelium of right eye As part of wet ARMD, still quiescent. Will repeat injection today at 29-month interval and examination again in 3 months  Exudative age-related macular degeneration of right eye with active choroidal neovascularization ([place]) History of multiple recurrences right I will repeat injection Avastin today and examination in 3 months      ICD-10-CM   1. Exudative age-related macular degeneration of right eye with active choroidal neovascularization (H[facility])  H35.3211 OCT, Retina - OU - Both Eyes    Intravitreal Injection, Pharmacologic Agent - OD - Right Eye    bevacizumab (AVASTIN) SOSY 2.5 mg  2. Serous detachment of retinal pigment epithelium of right eye  H35.721     1. Looks great OD, no signs of recurrence of serous retinal detachment associated with wet AMD. Currently at 53-month follow-up interval. Repeat examination in 3 months and plan is to maintain this interval potentially of maintenance treatment to prevent recurrences again. After 18 to 24 months may in fact attempt to observe thereafter  2. OS, no signs of CNVM will continue to observe  3.  Ophthalmic Meds Ordered this visit:  Meds ordered this encounter  Medications  . bevacizumab (AVASTIN) SOSY 2.5 mg       No follow-ups on file.  There are no Patient Instructions on file for this visit.   Explained the diagnoses, plan, and follow up with the patient and they expressed understanding.  Patient expressed understanding of the importance of proper follow up care.   Clent Demark Stepahnie Campo M.D. Diseases & Surgery of the Retina and Vitreous [facility] [place] 05/16/20     Abbreviations: M myopia  (nearsighted); A astigmatism; H hyperopia (farsighted); P presbyopia; Mrx spectacle prescription;  CTL contact lenses; OD right eye; OS left eye; OU both eyes  XT exotropia; ET esotropia; PEK punctate epithelial keratitis; PEE punctate epithelial erosions; DES dry eye syndrome; MGD meibomian gland dysfunction; ATs artificial tears; PFAT's preservative free artificial tears; [place] nuclear sclerotic cataract; PSC posterior subcapsular cataract; ERM epi-retinal membrane; PVD posterior vitreous detachment; RD retinal detachment; DM diabetes mellitus; DR diabetic retinopathy; NPDR non-proliferative diabetic retinopathy; PDR proliferative diabetic retinopathy; CSME clinically significant macular edema; DME diabetic macular edema; dbh dot blot hemorrhages; CWS cotton wool spot; POAG primary open angle glaucoma; C/D cup-to-disc ratio; HVF humphrey visual field; GVF goldmann visual field; OCT optical coherence tomography; IOP intraocular pressure; BRVO Branch retinal vein occlusion; CRVO central retinal vein occlusion; CRAO central retinal artery occlusion; BRAO branch retinal artery occlusion; RT retinal tear; SB scleral buckle; PPV pars plana vitrectomy; VH Vitreous hemorrhage; PRP panretinal laser photocoagulation; IVK intravitreal kenalog; VMT vitreomacular traction; MH Macular hole;  NVD neovascularization of the disc; NVE neovascularization elsewhere; AREDS age related eye disease study; ARMD age related macular degeneration; POAG primary open angle glaucoma; EBMD epithelial/anterior basement membrane dystrophy; ACIOL anterior chamber intraocular lens; IOL intraocular lens; PCIOL posterior chamber intraocular lens; Phaco/IOL phacoemulsification with intraocular lens placement; [place] photorefractive keratectomy; LASIK laser assisted in situ keratomileusis; HTN hypertension; DM diabetes mellitus; COPD chronic obstructive pulmonary disease

## 2020-05-16 NOTE — Assessment & Plan Note (Signed)
As part of wet ARMD, still quiescent. Will repeat injection today at 21-month interval and examination again in 3 months

## 2020-05-16 NOTE — Assessment & Plan Note (Signed)
History of multiple recurrences right I will repeat injection Avastin today and examination in 3 months

## 2020-05-17 ENCOUNTER — Ambulatory Visit: Payer: Medicare Other | Admitting: Physical Therapy

## 2020-05-17 ENCOUNTER — Encounter: Payer: Self-pay | Admitting: Physical Therapy

## 2020-05-17 DIAGNOSIS — M25661 Stiffness of right knee, not elsewhere classified: Secondary | ICD-10-CM

## 2020-05-17 DIAGNOSIS — G8929 Other chronic pain: Secondary | ICD-10-CM | POA: Diagnosis not present

## 2020-05-17 DIAGNOSIS — R600 Localized edema: Secondary | ICD-10-CM

## 2020-05-17 DIAGNOSIS — M25561 Pain in right knee: Secondary | ICD-10-CM | POA: Diagnosis not present

## 2020-05-17 DIAGNOSIS — M6281 Muscle weakness (generalized): Secondary | ICD-10-CM | POA: Diagnosis not present

## 2020-05-17 NOTE — Therapy (Signed)
[place]. [place], [place], [address] Phone: [phone]   Fax:  [phone]  Physical Therapy Treatment  Patient Details  Name: Angela Collier MRN: [id number] Date of Birth: 06-Nov-1945 Referring Provider (PT): Aluisio   Encounter Date: 05/17/2020   PT End of Session - 05/17/20 1052    Visit Number 3    Date for PT Re-Evaluation 07/07/20    PT Start Time 1016    PT Stop Time 1103    PT Time Calculation (min) 47 min    Activity Tolerance Patient tolerated treatment well    Behavior During Therapy [facility] for tasks assessed/performed           Past Medical History:  Diagnosis Date  . Arthritis    large joint   . Hyperlipidemia   . Macular degeneration   . Osteopenia   . Osteoporosis   . Pneumonia    childhood age 68   . Varicose veins     Past Surgical History:  Procedure Laterality Date  . CATARACT EXTRACTION Right    2011  . FRACTURE SURGERY  2007   BIL wrist  . PARTIAL HYSTERECTOMY    . TOTAL KNEE ARTHROPLASTY Right 01/10/2018   Procedure: RIGHT TOTAL KNEE ARTHROPLASTY;  Surgeon: Gaynelle Arabian, MD;  Location: [facility];  Service: Orthopedics;  Laterality: Right;  . WRIST FRACTURE SURGERY Bilateral 2004    There were no vitals filed for this visit.   Subjective Assessment - 05/17/20 1018    Subjective Pt reports knee is about the same    Currently in Pain? No/denies                             [facility] Adult PT Treatment/Exercise - 05/17/20 0001      Knee/Hip Exercises: Stretches   Gastroc Stretch Both;1 rep;30 seconds      Knee/Hip Exercises: Aerobic   Nustep L5 x 7 min LE only      Knee/Hip Exercises: Machines for Strengthening   Cybex Knee Extension 5# 1x10 BLE    Cybex Knee Flexion 20# 2x10 BLE      Knee/Hip Exercises: Standing   Heel Raises Both;1 set;15 reps    Forward Step Up Both;2 sets;5 reps;Step Height: 6";Hand Hold: 0    Walking with Sports Cord 30# x4 each  direction      Vasopneumatic   Number Minutes Vasopneumatic  10 minutes    Vasopnuematic Location  Knee    Vasopneumatic Pressure Medium    Vasopneumatic Temperature  34                    PT Short Term Goals - 05/09/20 1649      PT SHORT TERM GOAL #1   Title Pt will be I with initial HEP    Time 2    Period Weeks    Status New    Target Date 05/23/20             PT Long Term Goals - 05/09/20 1649      PT LONG TERM GOAL #1   Title Pt will be I with advanced HEP    Time 6    Period Weeks    Status New    Target Date 06/20/20      PT LONG TERM GOAL #2   Title Pt will demo R knee circumference </=2 cm diff from L knee  Baseline R knee 44 cm mid patella, L knee 40 cm    Time 6    Period Weeks    Status New    Target Date 06/20/20      PT LONG TERM GOAL #3   Title Pt will demo R knee flexion ROM 120    Time 6    Period Weeks    Status New    Target Date 06/20/20      PT LONG TERM GOAL #4   Title Pt will report resolution of feelings of R knee instability    Time 6    Period Weeks    Status New    Target Date 06/20/20                 Plan - 05/17/20 1052    Clinical Impression Statement Pt demos instability with occasional CGA required for resisted gait forward/backward stepping. Cuing to avoid scissoring with gait. Pt demos instability and requires HH assist with lateral step ups; did well with forward step ups. Continue to progress functional hip/knee strengthening. Vaso for edema at end of session.    PT Treatment/Interventions ADLs/Self Care Home Management;Cryotherapy;Electrical Stimulation;Iontophoresis 4mg /ml Dexamethasone;Gait training;Stair training;Therapeutic activities;Therapeutic exercise;Neuromuscular re-education;Manual techniques;Patient/family education;Passive range of motion;Vasopneumatic Device    PT Next Visit Plan knee ROM, knee/LE strength/stabilization, manual/modalities as indicated    Consulted and Agree with Plan of  Care Patient           Patient will benefit from skilled therapeutic intervention in order to improve the following deficits and impairments:  Abnormal gait,Decreased range of motion,Difficulty walking,Decreased activity tolerance,Pain,Hypomobility,Impaired flexibility,Decreased strength,Increased edema  Visit Diagnosis: Chronic pain of right knee  Stiffness of right knee, not elsewhere classified  Localized edema  Muscle weakness (generalized)     Problem List Patient Active Problem List   Diagnosis Date Noted  . Intermediate stage nonexudative age-related macular degeneration of left eye 02/08/2020  . Exudative age-related macular degeneration of right eye with active choroidal neovascularization ([place]) 08/10/2019  . Intermediate stage nonexudative age-related macular degeneration of right eye 08/10/2019  . Serous detachment of retinal pigment epithelium of right eye 08/10/2019  . Degenerative retinal drusen of both eyes 08/10/2019  . Pseudophakia 08/10/2019  . Osteoarthritis of right knee 01/12/2018  . OA (osteoarthritis) of knee 01/10/2018  . Age-related osteoporosis without current pathological fracture 08/12/2016  . Osteoarthritis of both knees 02/24/2016  . Knee effusion, right 02/24/2016  . Patellar luxation 02/24/2016  . Osteoarthritis of both feet 02/24/2016  . Osteoarthritis of both hands 02/24/2016  . Macular degeneration 02/24/2016  . Varicose veins of lower extremities with other complications 27/78/2423   Amador Cunas, PT, DPT Donald Prose Cade Dashner 05/17/2020, 10:54 AM  [place]. [place], [place], [address] Phone: [phone]   Fax:  [phone]  Name: Angela Collier MRN: [id number] Date of Birth: Nov 10, 1945

## 2020-05-21 ENCOUNTER — Other Ambulatory Visit: Payer: Self-pay

## 2020-05-21 ENCOUNTER — Ambulatory Visit: Payer: Medicare Other | Admitting: Physical Therapy

## 2020-05-21 ENCOUNTER — Encounter: Payer: Self-pay | Admitting: Physical Therapy

## 2020-05-21 DIAGNOSIS — R600 Localized edema: Secondary | ICD-10-CM

## 2020-05-21 DIAGNOSIS — M25561 Pain in right knee: Secondary | ICD-10-CM | POA: Diagnosis not present

## 2020-05-21 DIAGNOSIS — G8929 Other chronic pain: Secondary | ICD-10-CM

## 2020-05-21 DIAGNOSIS — M25661 Stiffness of right knee, not elsewhere classified: Secondary | ICD-10-CM | POA: Diagnosis not present

## 2020-05-21 DIAGNOSIS — M6281 Muscle weakness (generalized): Secondary | ICD-10-CM | POA: Diagnosis not present

## 2020-05-21 NOTE — Therapy (Signed)
[place]. [place], [place], [address] Phone: [phone]   Fax:  [phone]  Physical Therapy Treatment  Patient Details  Name: Angela Collier MRN: [id number] Date of Birth: 1946-02-23 Referring Provider (PT): Aluisio   Encounter Date: 05/21/2020   PT End of Session - 05/21/20 1146    Visit Number 4    Date for PT Re-Evaluation 07/07/20    PT Start Time 1102    PT Stop Time 1145    PT Time Calculation (min) 43 min    Activity Tolerance Patient tolerated treatment well    Behavior During Therapy [facility] for tasks assessed/performed           Past Medical History:  Diagnosis Date  . Arthritis    large joint   . Hyperlipidemia   . Macular degeneration   . Osteopenia   . Osteoporosis   . Pneumonia    childhood age 20   . Varicose veins     Past Surgical History:  Procedure Laterality Date  . CATARACT EXTRACTION Right    2011  . FRACTURE SURGERY  2007   BIL wrist  . PARTIAL HYSTERECTOMY    . TOTAL KNEE ARTHROPLASTY Right 01/10/2018   Procedure: RIGHT TOTAL KNEE ARTHROPLASTY;  Surgeon: Gaynelle Arabian, MD;  Location: [facility];  Service: Orthopedics;  Laterality: Right;  . WRIST FRACTURE SURGERY Bilateral 2004    There were no vitals filed for this visit.   Subjective Assessment - 05/21/20 1103    Subjective Pt reports knee is feeling better    Currently in Pain? No/denies    Pain Score 0-No pain    Pain Location Knee    Pain Orientation Right              [facility] PT Assessment - 05/21/20 0001      AROM   Right/Left Knee Left;Right    Right Knee Extension 0    Right Knee Flexion 120    Left Knee Flexion 130                         [facility] Adult PT Treatment/Exercise - 05/21/20 0001      Knee/Hip Exercises: Stretches   Gastroc Stretch Both;1 rep;30 seconds      Knee/Hip Exercises: Aerobic   Recumbent Bike L 1.5 x 7 min      Knee/Hip Exercises: Machines for Strengthening    Cybex Knee Extension 5# 2x10 BLE    Cybex Knee Flexion 25# 2x10 BLE    Cybex Leg Press 20# 2x10 BLE      Knee/Hip Exercises: Standing   Heel Raises Both;1 set;15 reps      Knee/Hip Exercises: Seated   Sit to Sand 2 sets;10 reps;without UE support   with airex on seat d/t pt c/o of knee pain from lower surface                   PT Short Term Goals - 05/21/20 1149      PT SHORT TERM GOAL #1   Title Pt will be I with initial HEP    Time 2    Period Weeks    Status Achieved    Target Date 05/23/20             PT Long Term Goals - 05/21/20 1149      PT LONG TERM GOAL #1   Title Pt will be I with advanced HEP  Time 6    Period Weeks    Status On-going      PT LONG TERM GOAL #2   Title Pt will demo R knee circumference </=2 cm diff from L knee    Baseline R knee 44 cm mid patella, L knee 40 cm    Time 6    Period Weeks    Status On-going      PT LONG TERM GOAL #3   Title Pt will demo R knee flexion ROM 120    Time 6    Period Weeks    Status Achieved      PT LONG TERM GOAL #4   Title Pt will report resolution of feelings of R knee instability    Time 6    Period Weeks    Status Partially Met                 Plan - 05/21/20 1147    Clinical Impression Statement Pt making progress towards LTG; demos R knee flexion 120 this rx improved from time of eval. Progressed pt HEP and pt demos understanding of ex's. Increased stability with resisted gait. Occasional LOB with sidestepping with PT correcting. Continue to progress functional hip/knee strengthening.    PT Treatment/Interventions ADLs/Self Care Home Management;Cryotherapy;Electrical Stimulation;Iontophoresis 78m/ml Dexamethasone;Gait training;Stair training;Therapeutic activities;Therapeutic exercise;Neuromuscular re-education;Manual techniques;Patient/family education;Passive range of motion;Vasopneumatic Device    PT Next Visit Plan knee ROM, knee/LE strength/stabilization, manual/modalities as  indicated    Consulted and Agree with Plan of Care Patient           Patient will benefit from skilled therapeutic intervention in order to improve the following deficits and impairments:  Abnormal gait,Decreased range of motion,Difficulty walking,Decreased activity tolerance,Pain,Hypomobility,Impaired flexibility,Decreased strength,Increased edema  Visit Diagnosis: Chronic pain of right knee  Stiffness of right knee, not elsewhere classified  Localized edema  Muscle weakness (generalized)     Problem List Patient Active Problem List   Diagnosis Date Noted  . Intermediate stage nonexudative age-related macular degeneration of left eye 02/08/2020  . Exudative age-related macular degeneration of right eye with active choroidal neovascularization ([place] 08/10/2019  . Intermediate stage nonexudative age-related macular degeneration of right eye 08/10/2019  . Serous detachment of retinal pigment epithelium of right eye 08/10/2019  . Degenerative retinal drusen of both eyes 08/10/2019  . Pseudophakia 08/10/2019  . Osteoarthritis of right knee 01/12/2018  . OA (osteoarthritis) of knee 01/10/2018  . Age-related osteoporosis without current pathological fracture 08/12/2016  . Osteoarthritis of both knees 02/24/2016  . Knee effusion, right 02/24/2016  . Patellar luxation 02/24/2016  . Osteoarthritis of both feet 02/24/2016  . Osteoarthritis of both hands 02/24/2016  . Macular degeneration 02/24/2016  . Varicose veins of lower extremities with other complications 003/83/3383  AAmador Cunas PT, DPT ADonald ProseSugg 05/21/2020, 11:50 AM  [place] [address]Phone: [phone]  Fax:  [phone] Name: Angela BovenMRN: [id number] of Birth: [birth year]

## 2020-05-24 ENCOUNTER — Encounter: Payer: Medicare Other | Admitting: Physical Therapy

## 2020-05-28 ENCOUNTER — Other Ambulatory Visit: Payer: Self-pay

## 2020-05-28 ENCOUNTER — Encounter: Payer: Self-pay | Admitting: Physical Therapy

## 2020-05-28 ENCOUNTER — Ambulatory Visit: Payer: Medicare Other | Admitting: Physical Therapy

## 2020-05-28 DIAGNOSIS — G8929 Other chronic pain: Secondary | ICD-10-CM | POA: Diagnosis not present

## 2020-05-28 DIAGNOSIS — M25661 Stiffness of right knee, not elsewhere classified: Secondary | ICD-10-CM | POA: Diagnosis not present

## 2020-05-28 DIAGNOSIS — M6281 Muscle weakness (generalized): Secondary | ICD-10-CM | POA: Diagnosis not present

## 2020-05-28 DIAGNOSIS — M25561 Pain in right knee: Secondary | ICD-10-CM

## 2020-05-28 DIAGNOSIS — R600 Localized edema: Secondary | ICD-10-CM

## 2020-05-28 NOTE — Therapy (Signed)
[place] [place], [place], [address] Phone: [phone]   Fax:  [phone]  Physical Therapy Treatment  Patient Details  Name: Angela Collier MRN: [id number] Date of Birth: 1945/11/11 Referring Provider (PT): Aluisio   Encounter Date: 05/28/2020   PT End of Session - 05/28/20 1354    Visit Number 5    Date for PT Re-Evaluation 07/07/20    PT Start Time 1318    PT Stop Time 1404    PT Time Calculation (min) 46 min    Activity Tolerance Patient tolerated treatment well    Behavior During Therapy [facility] for tasks assessed/performed           Past Medical History:  Diagnosis Date  . Arthritis    large joint   . Hyperlipidemia   . Macular degeneration   . Osteopenia   . Osteoporosis   . Pneumonia    childhood age 46   . Varicose veins     Past Surgical History:  Procedure Laterality Date  . CATARACT EXTRACTION Right    2011  . FRACTURE SURGERY  2007   BIL wrist  . PARTIAL HYSTERECTOMY    . TOTAL KNEE ARTHROPLASTY Right 01/10/2018   Procedure: RIGHT TOTAL KNEE ARTHROPLASTY;  Surgeon: Gaynelle Arabian, MD;  Location: [facility];  Service: Orthopedics;  Laterality: Right;  . WRIST FRACTURE SURGERY Bilateral 2004    There were no vitals filed for this visit.   Subjective Assessment - 05/28/20 1314    Subjective Pt reports knee is feeling better overall    Currently in Pain? No/denies    Pain Score 0-No pain                             [facility] Adult PT Treatment/Exercise - 05/28/20 0001      Knee/Hip Exercises: Aerobic   Recumbent Bike L2 x 6 min      Knee/Hip Exercises: Machines for Strengthening   Cybex Knee Extension 5# 2x10 BLE    Cybex Knee Flexion 25# 2x10 BLE    Cybex Leg Press 20# 2x10 BLE; heel raises 20# 1x10      Knee/Hip Exercises: Standing   Heel Raises Both;1 set;15 reps    Walking with Sports Cord 30# x5 each direction      Vasopneumatic   Number Minutes  Vasopneumatic  10 minutes    Vasopnuematic Location  Knee    Vasopneumatic Pressure Medium    Vasopneumatic Temperature  34                    PT Short Term Goals - 05/21/20 1149      PT SHORT TERM GOAL #1   Title Pt will be I with initial HEP    Time 2    Period Weeks    Status Achieved    Target Date 05/23/20             PT Long Term Goals - 05/21/20 1149      PT LONG TERM GOAL #1   Title Pt will be I with advanced HEP    Time 6    Period Weeks    Status On-going      PT LONG TERM GOAL #2   Title Pt will demo R knee circumference </=2 cm diff from L knee    Baseline R knee 44 cm mid patella, L knee 40 cm    Time  6    Period Weeks    Status On-going      PT LONG TERM GOAL #3   Title Pt will demo R knee flexion ROM 120    Time 6    Period Weeks    Status Achieved      PT LONG TERM GOAL #4   Title Pt will report resolution of feelings of R knee instability    Time 6    Period Weeks    Status Partially Met                 Plan - 05/28/20 1354    Clinical Impression Statement Pt presents to clinic with reports of improvements in R knee pain and function overall. Still with some L knee pain with seated knee extensions; does well with RLE single leg extensions. Cues to avoid hyperextension with leg press. Improved stability with resisted gait; some hip instability noted. Continue to progress functional hip/knee strengthening.    PT Treatment/Interventions ADLs/Self Care Home Management;Cryotherapy;Electrical Stimulation;Iontophoresis 53m/ml Dexamethasone;Gait training;Stair training;Therapeutic activities;Therapeutic exercise;Neuromuscular re-education;Manual techniques;Patient/family education;Passive range of motion;Vasopneumatic Device    PT Next Visit Plan knee ROM, knee/LE strength/stabilization, manual/modalities as indicated    Consulted and Agree with Plan of Care Patient           Patient will benefit from skilled therapeutic  intervention in order to improve the following deficits and impairments:  Abnormal gait,Decreased range of motion,Difficulty walking,Decreased activity tolerance,Pain,Hypomobility,Impaired flexibility,Decreased strength,Increased edema  Visit Diagnosis: Chronic pain of right knee  Stiffness of right knee, not elsewhere classified  Localized edema  Muscle weakness (generalized)     Problem List Patient Active Problem List   Diagnosis Date Noted  . Intermediate stage nonexudative age-related macular degeneration of left eye 02/08/2020  . Exudative age-related macular degeneration of right eye with active choroidal neovascularization ([place] 08/10/2019  . Intermediate stage nonexudative age-related macular degeneration of right eye 08/10/2019  . Serous detachment of retinal pigment epithelium of right eye 08/10/2019  . Degenerative retinal drusen of both eyes 08/10/2019  . Pseudophakia 08/10/2019  . Osteoarthritis of right knee 01/12/2018  . OA (osteoarthritis) of knee 01/10/2018  . Age-related osteoporosis without current pathological fracture 08/12/2016  . Osteoarthritis of both knees 02/24/2016  . Knee effusion, right 02/24/2016  . Patellar luxation 02/24/2016  . Osteoarthritis of both feet 02/24/2016  . Osteoarthritis of both hands 02/24/2016  . Macular degeneration 02/24/2016  . Varicose veins of lower extremities with other complications 093/26/7124  AAmador Cunas PT, DPT ADonald ProseSugg 05/28/2020, 1:57 PM  [place] [address]Phone: [phone]  Fax:  [phone] Name: Angela HaslipMRN: [id number] of Birth: [birth year]

## 2020-05-29 NOTE — Progress Notes (Deleted)
Office Visit Note  Patient: Angela Collier             Date of Birth: [birth year]           MRN: [id number]             PCP: Mayra Neer, MD Referring: Mayra Neer, MD Visit Date: 06/12/2020 Occupation: @GUAROCC @  Subjective:  No chief complaint on file.   History of Present Illness: Angela Collier is a 75 y.o. female ***   Activities of Daily Living:  Patient reports morning stiffness for *** {minute/hour:19697}.   Patient {ACTIONS;DENIES/REPORTS:21021675::"Denies"} nocturnal pain.  Difficulty dressing/grooming: {ACTIONS;DENIES/REPORTS:21021675::"Denies"} Difficulty climbing stairs: {ACTIONS;DENIES/REPORTS:21021675::"Denies"} Difficulty getting out of chair: {ACTIONS;DENIES/REPORTS:21021675::"Denies"} Difficulty using hands for taps, buttons, cutlery, and/or writing: {ACTIONS;DENIES/REPORTS:21021675::"Denies"}  No Rheumatology ROS completed.   PMFS History:  Patient Active Problem List   Diagnosis Date Noted  . Intermediate stage nonexudative age-related macular degeneration of left eye 02/08/2020  . Exudative age-related macular degeneration of right eye with active choroidal neovascularization ([place]) 08/10/2019  . Intermediate stage nonexudative age-related macular degeneration of right eye 08/10/2019  . Serous detachment of retinal pigment epithelium of right eye 08/10/2019  . Degenerative retinal drusen of both eyes 08/10/2019  . Pseudophakia 08/10/2019  . Osteoarthritis of right knee 01/12/2018  . OA (osteoarthritis) of knee 01/10/2018  . Age-related osteoporosis without current pathological fracture 08/12/2016  . Osteoarthritis of both knees 02/24/2016  . Knee effusion, right 02/24/2016  . Patellar luxation 02/24/2016  . Osteoarthritis of both feet 02/24/2016  . Osteoarthritis of both hands 02/24/2016  . Macular degeneration 02/24/2016  . Varicose veins of lower extremities with other complications 25/36/6440    Past Medical History:  Diagnosis Date   . Arthritis    large joint   . Hyperlipidemia   . Macular degeneration   . Osteopenia   . Osteoporosis   . Pneumonia    childhood age 30   . Varicose veins     Family History  Problem Relation Age of Onset  . Osteoarthritis Mother   . Rheum arthritis Mother   . Cancer Father        liver  . Heart disease Father   . Hyperlipidemia Father   . Hypertension Father   . Heart attack Father   . Other Father        varicose veins  . Hyperlipidemia Sister   . Healthy Son   . Healthy Daughter    Past Surgical History:  Procedure Laterality Date  . CATARACT EXTRACTION Right    2011  . FRACTURE SURGERY  2007   BIL wrist  . PARTIAL HYSTERECTOMY    . TOTAL KNEE ARTHROPLASTY Right 01/10/2018   Procedure: RIGHT TOTAL KNEE ARTHROPLASTY;  Surgeon: Gaynelle Arabian, MD;  Location: [facility];  Service: Orthopedics;  Laterality: Right;  . WRIST FRACTURE SURGERY Bilateral 2004   Social History   Social History Narrative  . Not on file   Immunization History  Administered Date(s) Administered  . PFIZER(Purple Top)SARS-COV-2 Vaccination 05/20/2019, 06/13/2019     Objective: Vital Signs: There were no vitals taken for this visit.   Physical Exam   Musculoskeletal Exam: ***  CDAI Exam: CDAI Score: - Patient Global: -; Provider Global: - Swollen: -; Tender: - Joint Exam 06/12/2020   No joint exam has been documented for this visit   There is currently no information documented on the homunculus. Go to the Rheumatology activity and complete the homunculus joint exam.  Investigation: No additional findings.  Imaging: Intravitreal Injection,  Pharmacologic Agent - OD - Right Eye  Result Date: 05/16/2020 Time Out 05/16/2020. 11:47 AM. Confirmed correct patient, procedure, site, and patient consented. Anesthesia Topical anesthesia was used. Anesthetic medications included Akten 3.5%. Procedure Preparation included 5% betadine to ocular surface, 10% betadine to eyelids, Tobramycin 0.3%. A  30 gauge needle was used. Injection: 2.5 mg Bevacizumab (AVASTIN) 2.5mg /0.57mL SOSY   NDC: 19379-024-09, Lot: [id number]   Route: Intravitreal, Site: Right Eye Post-op Post injection exam found visual acuity of at least counting fingers. The patient tolerated the procedure well. There were no complications. The patient received written and verbal post procedure care education. Post injection medications were not given.   OCT, Retina - OU - Both Eyes  Result Date: 05/16/2020 Right Eye Quality was good. Scan locations included subfoveal. Central Foveal Thickness: 255. Left Eye Quality was good. Scan locations included subfoveal. Central Foveal Thickness: 268.    Recent Labs: Lab Results  Component Value Date   WBC 8.2 01/12/2018   HGB 11.0 (L) 01/12/2018   PLT 130 (L) 01/12/2018   NA 143 01/12/2018   K 4.2 01/12/2018   CL 108 01/12/2018   CO2 27 01/12/2018   GLUCOSE 112 (H) 01/12/2018   BUN 17 01/12/2018   CREATININE 0.70 01/12/2018   BILITOT 1.1 01/05/2018   ALKPHOS 44 01/05/2018   AST 26 01/05/2018   ALT 24 01/05/2018   PROT 6.9 01/05/2018   ALBUMIN 4.6 01/05/2018   CALCIUM 8.7 (L) 01/12/2018   GFRAA >60 01/12/2018    Speciality Comments: No specialty comments available.  Procedures:  No procedures performed Allergies: Sulfa antibiotics and Sulfasalazine   Assessment / Plan:     Visit Diagnoses: No diagnosis found.  Orders: No orders of the defined types were placed in this encounter.  No orders of the defined types were placed in this encounter.   Face-to-face time spent with patient was *** minutes. Greater than 50% of time was spent in counseling and coordination of care.  Follow-Up Instructions: No follow-ups on file.   Earnestine Mealing, CMA  Note - This record has been created using Editor, commissioning.  Chart creation errors have been sought, but may not always  have been located. Such creation errors do not reflect on  the standard of medical care.

## 2020-06-05 ENCOUNTER — Ambulatory Visit: Payer: Medicare Other | Admitting: Physical Therapy

## 2020-06-11 ENCOUNTER — Other Ambulatory Visit: Payer: Self-pay

## 2020-06-11 ENCOUNTER — Encounter: Payer: Self-pay | Admitting: Physical Therapy

## 2020-06-11 ENCOUNTER — Ambulatory Visit: Payer: Medicare Other | Attending: Orthopedic Surgery | Admitting: Physical Therapy

## 2020-06-11 DIAGNOSIS — R600 Localized edema: Secondary | ICD-10-CM | POA: Diagnosis not present

## 2020-06-11 DIAGNOSIS — G8929 Other chronic pain: Secondary | ICD-10-CM | POA: Insufficient documentation

## 2020-06-11 DIAGNOSIS — M25661 Stiffness of right knee, not elsewhere classified: Secondary | ICD-10-CM | POA: Insufficient documentation

## 2020-06-11 DIAGNOSIS — M6281 Muscle weakness (generalized): Secondary | ICD-10-CM | POA: Diagnosis not present

## 2020-06-11 DIAGNOSIS — M25561 Pain in right knee: Secondary | ICD-10-CM | POA: Insufficient documentation

## 2020-06-11 NOTE — Therapy (Signed)
[place]. [place], [place], [address] Phone: [phone]   Fax:  [phone]  Physical Therapy Treatment  Patient Details  Name: Angela Collier MRN: [id number] Date of Birth: 29-May-1945 Referring Provider (PT): Aluisio   Encounter Date: 06/11/2020   PT End of Session - 06/11/20 1612    Visit Number 6    Date for PT Re-Evaluation 07/07/20    PT Start Time 3536    PT Stop Time 1610    PT Time Calculation (min) 39 min    Activity Tolerance Patient tolerated treatment well    Behavior During Therapy [facility] for tasks assessed/performed           Past Medical History:  Diagnosis Date  . Arthritis    large joint   . Hyperlipidemia   . Macular degeneration   . Osteopenia   . Osteoporosis   . Pneumonia    childhood age 44   . Varicose veins     Past Surgical History:  Procedure Laterality Date  . CATARACT EXTRACTION Right    2011  . FRACTURE SURGERY  2007   BIL wrist  . PARTIAL HYSTERECTOMY    . TOTAL KNEE ARTHROPLASTY Right 01/10/2018   Procedure: RIGHT TOTAL KNEE ARTHROPLASTY;  Surgeon: Gaynelle Arabian, MD;  Location: [facility];  Service: Orthopedics;  Laterality: Right;  . WRIST FRACTURE SURGERY Bilateral 2004    There were no vitals filed for this visit.   Subjective Assessment - 06/11/20 1541    Subjective Pt reports knee is feeling good and that she is not having any pain or instability.    Currently in Pain? No/denies    Pain Score 0-No pain    Pain Location Knee    Pain Orientation Right              [facility] PT Assessment - 06/11/20 0001      AROM   Right Knee Flexion 123                         [facility] Adult PT Treatment/Exercise - 06/11/20 0001      Self-Care   Self-Care Other Self-Care Comments    Other Self-Care Comments  educated on continuance of HEP, progression of ex's/resistance, and when to return      Knee/Hip Exercises: Aerobic   Nustep L5 x 10 min       Knee/Hip Exercises: Sidelying   Clams x10 B green TB                    PT Short Term Goals - 05/21/20 1149      PT SHORT TERM GOAL #1   Title Pt will be I with initial HEP    Time 2    Period Weeks    Status Achieved    Target Date 05/23/20             PT Long Term Goals - 06/11/20 1545      PT LONG TERM GOAL #1   Title Pt will be I with advanced HEP    Time 6    Period Weeks    Status Achieved      PT LONG TERM GOAL #2   Title Pt will demo R knee circumference </=2 cm diff from L knee    Baseline R knee 43 cm mid patella, L knee 41 cm    Time 6    Period Weeks  Status Achieved      PT LONG TERM GOAL #3   Title Pt will demo R knee flexion ROM 120    Time 6    Period Weeks    Status Achieved      PT LONG TERM GOAL #4   Title Pt will report resolution of feelings of R knee instability    Time 6    Period Weeks    Status Achieved                 Plan - 06/11/20 1612    Clinical Impression Statement Pt being put on hold secondary to being pleased with functional progress and meeting all LTGs. Pt demos R knee ROM equivalent to L knee, R knee swelling reduced from time of eval, and reports resolution of knee pain//instability. Extensive education on continuance of HEP, progression of ex's, and when to return if symptoms worsen/recur with pt VU and agreement.    PT Treatment/Interventions ADLs/Self Care Home Management;Cryotherapy;Electrical Stimulation;Iontophoresis 4mg /ml Dexamethasone;Gait training;Stair training;Therapeutic activities;Therapeutic exercise;Neuromuscular re-education;Manual techniques;Patient/family education;Passive range of motion;Vasopneumatic Device    PT Next Visit Plan on hold this rx    Consulted and Agree with Plan of Care Patient           Patient will benefit from skilled therapeutic intervention in order to improve the following deficits and impairments:  Abnormal gait,Decreased range of motion,Difficulty  walking,Decreased activity tolerance,Pain,Hypomobility,Impaired flexibility,Decreased strength,Increased edema  Visit Diagnosis: Chronic pain of right knee  Stiffness of right knee, not elsewhere classified  Localized edema  Muscle weakness (generalized)     Problem List Patient Active Problem List   Diagnosis Date Noted  . Intermediate stage nonexudative age-related macular degeneration of left eye 02/08/2020  . Exudative age-related macular degeneration of right eye with active choroidal neovascularization ([place]) 08/10/2019  . Intermediate stage nonexudative age-related macular degeneration of right eye 08/10/2019  . Serous detachment of retinal pigment epithelium of right eye 08/10/2019  . Degenerative retinal drusen of both eyes 08/10/2019  . Pseudophakia 08/10/2019  . Osteoarthritis of right knee 01/12/2018  . OA (osteoarthritis) of knee 01/10/2018  . Age-related osteoporosis without current pathological fracture 08/12/2016  . Osteoarthritis of both knees 02/24/2016  . Knee effusion, right 02/24/2016  . Patellar luxation 02/24/2016  . Osteoarthritis of both feet 02/24/2016  . Osteoarthritis of both hands 02/24/2016  . Macular degeneration 02/24/2016  . Varicose veins of lower extremities with other complications 66/44/0347   Amador Cunas, PT, DPT Donald Prose Dontrez Pettis 06/11/2020, 4:15 PM  [place]. [place], [place], [address] Phone: [phone]   Fax:  [phone]  Name: Angela Collier MRN: [id number] Date of Birth: Jan 08, 1946

## 2020-06-12 ENCOUNTER — Ambulatory Visit: Payer: Medicare Other | Admitting: Rheumatology

## 2020-06-12 DIAGNOSIS — M1990 Unspecified osteoarthritis, unspecified site: Secondary | ICD-10-CM

## 2020-06-12 DIAGNOSIS — M19072 Primary osteoarthritis, left ankle and foot: Secondary | ICD-10-CM

## 2020-06-12 DIAGNOSIS — Z96651 Presence of right artificial knee joint: Secondary | ICD-10-CM

## 2020-06-12 DIAGNOSIS — S83006D Unspecified dislocation of unspecified patella, subsequent encounter: Secondary | ICD-10-CM

## 2020-06-12 DIAGNOSIS — M19042 Primary osteoarthritis, left hand: Secondary | ICD-10-CM

## 2020-06-12 DIAGNOSIS — M810 Age-related osteoporosis without current pathological fracture: Secondary | ICD-10-CM

## 2020-06-12 DIAGNOSIS — M1712 Unilateral primary osteoarthritis, left knee: Secondary | ICD-10-CM

## 2020-06-12 NOTE — Progress Notes (Signed)
Office Visit Note  Patient: Angela Collier             Date of Birth: [birth year]           MRN: [id number]             PCP: Mayra Neer, MD Referring: Mayra Neer, MD Visit Date: 06/13/2020 Occupation: @GUAROCC @  Subjective:  Right knee pain.   History of Present Illness: Angela Collier is a 75 y.o. female with a history of inflammatory arthritis. She uses natural anti-inflammatories with occasional ibuporfen as needed. She states her joints have been stable. She wears ring splints on her 2nd and 5th finger of her left hand. She had an episode of pain in her middle finger of her right hand which resolved with tylenol. She has had some swelling in her knees bilaterally this summer. She went to her orhtopedic surgeon for the right knee joint aspiration. She recalls there being blood in the aspiration but is unsure what the report was. She has felt weakness in her knee with some tightness that has caused her to limp. She started PT in 04/2020 which have resolved her symptoms. She had a right knee replacement on 01/10/2018. She has been staying active through swimming, stationary biking and walking.  None of the other joints are painful.  She has been very active.  She has been practicing yoga on a regular basis.  She does Art gallery manager.     Activities of Daily Living:  Patient reports morning stiffness for 0 none.   Patient Denies nocturnal pain.  Difficulty dressing/grooming: Denies Difficulty climbing stairs: Reports Difficulty getting out of chair: Denies Difficulty using hands for taps, buttons, cutlery, and/or writing: Denies  Review of Systems  Constitutional: Negative for fatigue.  HENT: Negative for mouth dryness.   Eyes: Negative for dryness.  Respiratory: Negative for shortness of breath.   Cardiovascular: Negative for swelling in legs/feet.  Gastrointestinal: Negative for constipation.  Endocrine: Negative for excessive thirst.  Genitourinary: Negative for  difficulty urinating.  Musculoskeletal: Positive for arthralgias, joint pain, joint swelling and muscle weakness.  Skin: Negative for rash.  Allergic/Immunologic: Negative for susceptible to infections.  Neurological: Negative for numbness.  Hematological: Negative for bruising/bleeding tendency.  Psychiatric/Behavioral: Negative for sleep disturbance.    PMFS History:  Patient Active Problem List   Diagnosis Date Noted  . Intermediate stage nonexudative age-related macular degeneration of left eye 02/08/2020  . Exudative age-related macular degeneration of right eye with active choroidal neovascularization ([place]) 08/10/2019  . Intermediate stage nonexudative age-related macular degeneration of right eye 08/10/2019  . Serous detachment of retinal pigment epithelium of right eye 08/10/2019  . Degenerative retinal drusen of both eyes 08/10/2019  . Pseudophakia 08/10/2019  . Osteoarthritis of right knee 01/12/2018  . OA (osteoarthritis) of knee 01/10/2018  . Age-related osteoporosis without current pathological fracture 08/12/2016  . Osteoarthritis of both knees 02/24/2016  . Knee effusion, right 02/24/2016  . Patellar luxation 02/24/2016  . Osteoarthritis of both feet 02/24/2016  . Osteoarthritis of both hands 02/24/2016  . Macular degeneration 02/24/2016  . Varicose veins of lower extremities with other complications 95/18/8416    Past Medical History:  Diagnosis Date  . Arthritis    large joint   . Hyperlipidemia   . Macular degeneration   . Osteopenia   . Osteoporosis   . Pneumonia    childhood age 86   . Varicose veins     Family History  Problem Relation Age of Onset  .  Osteoarthritis Mother   . Rheum arthritis Mother   . Cancer Father        liver  . Heart disease Father   . Hyperlipidemia Father   . Hypertension Father   . Heart attack Father   . Other Father        varicose veins  . Hyperlipidemia Sister   . Healthy Son   . Healthy Daughter    Past  Surgical History:  Procedure Laterality Date  . CATARACT EXTRACTION Right    2011  . FRACTURE SURGERY  2007   BIL wrist  . PARTIAL HYSTERECTOMY    . TOTAL KNEE ARTHROPLASTY Right 01/10/2018   Procedure: RIGHT TOTAL KNEE ARTHROPLASTY;  Surgeon: Gaynelle Arabian, MD;  Location: [facility];  Service: Orthopedics;  Laterality: Right;  . WRIST FRACTURE SURGERY Bilateral 2004   Social History   Social History Narrative  . Not on file   Immunization History  Administered Date(s) Administered  . PFIZER(Purple Top)SARS-COV-2 Vaccination 05/20/2019, 06/13/2019, 02/07/2020     Objective: Vital Signs: BP 128/68 (BP Location: Left Arm, Patient Position: Sitting, Cuff Size: Normal)   Pulse 76   Resp 16   Ht 5\' 3"  (1.6 m)   Wt 143 lb (64.9 kg)   BMI 25.33 kg/m    Physical Exam Constitutional:      Appearance: Normal appearance.  HENT:     Head: Normocephalic and atraumatic.  Cardiovascular:     Rate and Rhythm: Normal rate and regular rhythm.  Pulmonary:     Effort: Pulmonary effort is normal.  Abdominal:     Palpations: Abdomen is soft.  Neurological:     Mental Status: She is alert and oriented to person, place, and time.      Musculoskeletal Exam: C-spine thoracic and lumbar spine with good range of motion.  Shoulder joints, elbow joints, wrist joints with good range of motion.  She has bilateral CMC prominence.  She has PIP and DIP subluxation with no synovitis.  She has been using Raynauds splints which has been helpful.  Hip joints with good range of motion.  She has warmth and a small effusion in her right knee joint which is replaced.  Left knee joint was in good range of motion.  She has overcrowding of toes in bilateral pes cavus and hammertoes.  CDAI Exam: CDAI Score: -- Patient Global: --; Provider Global: -- Swollen: --; Tender: -- Joint Exam 06/13/2020   No joint exam has been documented for this visit   There is currently no information documented on the homunculus.  Go to the Rheumatology activity and complete the homunculus joint exam.  Investigation: No additional findings.  Imaging: Intravitreal Injection, Pharmacologic Agent - OD - Right Eye  Result Date: 05/16/2020 Time Out 05/16/2020. 11:47 AM. Confirmed correct patient, procedure, site, and patient consented. Anesthesia Topical anesthesia was used. Anesthetic medications included Akten 3.5%. Procedure Preparation included 5% betadine to ocular surface, 10% betadine to eyelids, Tobramycin 0.3%. A 30 gauge needle was used. Injection: 2.5 mg Bevacizumab (AVASTIN) 2.5mg /0.47mL SOSY   NDC: 94174-081-44, Lot: [id number]   Route: Intravitreal, Site: Right Eye Post-op Post injection exam found visual acuity of at least counting fingers. The patient tolerated the procedure well. There were no complications. The patient received written and verbal post procedure care education. Post injection medications were not given.   OCT, Retina - OU - Both Eyes  Result Date: 05/16/2020 Right Eye Quality was good. Scan locations included subfoveal. Central Foveal Thickness: 255. Left Eye Quality  was good. Scan locations included subfoveal. Central Foveal Thickness: 268.    Recent Labs: Lab Results  Component Value Date   WBC 8.2 01/12/2018   HGB 11.0 (L) 01/12/2018   PLT 130 (L) 01/12/2018   NA 143 01/12/2018   K 4.2 01/12/2018   CL 108 01/12/2018   CO2 27 01/12/2018   GLUCOSE 112 (H) 01/12/2018   BUN 17 01/12/2018   CREATININE 0.70 01/12/2018   BILITOT 1.1 01/05/2018   ALKPHOS 44 01/05/2018   AST 26 01/05/2018   ALT 24 01/05/2018   PROT 6.9 01/05/2018   ALBUMIN 4.6 01/05/2018   CALCIUM 8.7 (L) 01/12/2018   GFRAA >60 01/12/2018    Speciality Comments: No specialty comments available.  Procedures:  No procedures performed Allergies: Rosanil cleanser [sulfacetamide sodium-sulfur], Sulfa antibiotics, and Sulfasalazine   Assessment / Plan:     Visit Diagnoses: Inflammatory arthritis - Seronegative, history of  recurrent knee effusions.  Patient has not had knee effusions since she had the right knee replacement until recently.  She is uncertain if it was due to increased activity.  She had knee joint aspiration and injection by the orthopedic surgeon.  I do not have synovial fluid analysis results available.  She had effusion on my examination today.  Status post total knee replacement, right - January 09, 2018 by Dr. Maureen Ralphs.   Primary osteoarthritis of both hands - she has severe osteoarthritis in her hands with DIP subluxation.  She has been using wrist splints.  Joint protection was discussed.  Primary osteoarthritis of left knee-she had no discomfort with range of motion of left knee joint.  Primary osteoarthritis of both feet - she has severe osteoarthritis in her feet with MTP subluxation.  She had no synovitis on examination.  She has bilateral pes cavus and hammertoes.  Proper fitting shoes with arch supports and metatarsal pads were discussed.  Patellar dislocation, unspecified laterality, subsequent encounter  Age-related osteoporosis without current pathological fracture - DXA on July 29, 2016 showed T score of -2.5 at [place].  Patient states she has been getting DEXA scan through her PCP.  She has been taking calcium and vitamin D.  She is doing resistive exercises.  Orders: No orders of the defined types were placed in this encounter.  No orders of the defined types were placed in this encounter.   .  Follow-Up Instructions: Return in about 1 year (around 06/13/2021) for Osteoarthritis.   Bo Merino, MD  Note - This record has been created using Editor, commissioning.  Chart creation errors have been sought, but may not always  have been located. Such creation errors do not reflect on  the standard of medical care.

## 2020-06-13 ENCOUNTER — Encounter: Payer: Self-pay | Admitting: Rheumatology

## 2020-06-13 ENCOUNTER — Other Ambulatory Visit: Payer: Self-pay

## 2020-06-13 ENCOUNTER — Ambulatory Visit: Payer: Medicare Other | Admitting: Rheumatology

## 2020-06-13 VITALS — BP 128/68 | HR 76 | Resp 16 | Ht 63.0 in | Wt 143.0 lb

## 2020-06-13 DIAGNOSIS — M19041 Primary osteoarthritis, right hand: Secondary | ICD-10-CM

## 2020-06-13 DIAGNOSIS — M19072 Primary osteoarthritis, left ankle and foot: Secondary | ICD-10-CM

## 2020-06-13 DIAGNOSIS — M810 Age-related osteoporosis without current pathological fracture: Secondary | ICD-10-CM | POA: Diagnosis not present

## 2020-06-13 DIAGNOSIS — Z96651 Presence of right artificial knee joint: Secondary | ICD-10-CM | POA: Diagnosis not present

## 2020-06-13 DIAGNOSIS — M19042 Primary osteoarthritis, left hand: Secondary | ICD-10-CM | POA: Diagnosis not present

## 2020-06-13 DIAGNOSIS — M19071 Primary osteoarthritis, right ankle and foot: Secondary | ICD-10-CM | POA: Diagnosis not present

## 2020-06-13 DIAGNOSIS — S83006D Unspecified dislocation of unspecified patella, subsequent encounter: Secondary | ICD-10-CM

## 2020-06-13 DIAGNOSIS — M1990 Unspecified osteoarthritis, unspecified site: Secondary | ICD-10-CM | POA: Diagnosis not present

## 2020-06-13 DIAGNOSIS — M1712 Unilateral primary osteoarthritis, left knee: Secondary | ICD-10-CM

## 2020-07-05 ENCOUNTER — Other Ambulatory Visit: Payer: Self-pay

## 2020-07-05 ENCOUNTER — Encounter: Payer: Self-pay | Admitting: Physical Therapy

## 2020-07-05 ENCOUNTER — Ambulatory Visit: Payer: Medicare Other | Admitting: Physical Therapy

## 2020-07-05 DIAGNOSIS — M25561 Pain in right knee: Secondary | ICD-10-CM | POA: Diagnosis not present

## 2020-07-05 DIAGNOSIS — M6281 Muscle weakness (generalized): Secondary | ICD-10-CM | POA: Diagnosis not present

## 2020-07-05 DIAGNOSIS — R600 Localized edema: Secondary | ICD-10-CM

## 2020-07-05 DIAGNOSIS — M25661 Stiffness of right knee, not elsewhere classified: Secondary | ICD-10-CM

## 2020-07-05 DIAGNOSIS — G8929 Other chronic pain: Secondary | ICD-10-CM

## 2020-07-05 NOTE — Therapy (Signed)
[place] [place], [place], [address] Phone: [phone]   Fax:  [phone]  Physical Therapy Treatment PHYSICAL THERAPY DISCHARGE SUMMARY  Visits from Start of Care: 7  Plan: Patient agrees to discharge.  Patient goals were met. Patient is being discharged due to meeting the stated rehab goals.  ?????     Patient Details  Name: Angela Collier MRN: [id number] Date of Birth: October 21, 1945 Referring Provider (PT): Aluisio   Encounter Date: 07/05/2020   PT End of Session - 07/05/20 1145    Visit Number 7    Date for PT Re-Evaluation 07/07/20    PT Start Time 1104    PT Stop Time 1143    PT Time Calculation (min) 39 min    Activity Tolerance Patient tolerated treatment well    Behavior During Therapy [facility] for tasks assessed/performed           Past Medical History:  Diagnosis Date  . Arthritis    large joint   . Hyperlipidemia   . Macular degeneration   . Osteopenia   . Osteoporosis   . Pneumonia    childhood age 77   . Varicose veins     Past Surgical History:  Procedure Laterality Date  . CATARACT EXTRACTION Right    2011  . FRACTURE SURGERY  2007   BIL wrist  . PARTIAL HYSTERECTOMY    . TOTAL KNEE ARTHROPLASTY Right 01/10/2018   Procedure: RIGHT TOTAL KNEE ARTHROPLASTY;  Surgeon: Gaynelle Arabian, MD;  Location: [facility];  Service: Orthopedics;  Laterality: Right;  . WRIST FRACTURE SURGERY Bilateral 2004    There were no vitals filed for this visit.   Subjective Assessment - 07/05/20 1106    Subjective Pt reports that her knees are still feeling pretty good overall. States that she is having some LBP and some straining when working out. Wants to have PT evaluate her form with some exercises.    Currently in Pain? No/denies    Pain Score 0-No pain    Pain Location Knee    Pain Orientation Right    Multiple Pain Sites Yes    Pain Score 3    Pain Location Back    Pain Orientation Lower     Pain Descriptors / Indicators Aching              [facility] PT Assessment - 07/05/20 0001      Circumferential Edema   Circumferential - Right 41.6 mid patella    Circumferential - Left  40.6 cm mid patella      AROM   Right Knee Flexion 128                         [facility] Adult PT Treatment/Exercise - 07/05/20 0001      Self-Care   Self-Care Other Self-Care Comments    Other Self-Care Comments  education on continuance of HEP, resumption of water aerobics and yoga, form with lumbar/core ex's, and incoporating some LB stretching into daily routine      Knee/Hip Exercises: Supine   Other Supine Knee/Hip Exercises SLR, bridges, clamshells, piriformis stretch, supine marching, PPT all with correction for appropriate form                    PT Short Term Goals - 05/21/20 1149      PT SHORT TERM GOAL #1   Title Pt will be I with initial HEP  Time 2    Period Weeks    Status Achieved    Target Date 05/23/20             PT Long Term Goals - 06/11/20 1545      PT LONG TERM GOAL #1   Title Pt will be I with advanced HEP    Time 6    Period Weeks    Status Achieved      PT LONG TERM GOAL #2   Title Pt will demo R knee circumference </=2 cm diff from L knee    Baseline R knee 43 cm mid patella, L knee 41 cm    Time 6    Period Weeks    Status Achieved      PT LONG TERM GOAL #3   Title Pt will demo R knee flexion ROM 120    Time 6    Period Weeks    Status Achieved      PT LONG TERM GOAL #4   Title Pt will report resolution of feelings of R knee instability    Time 6    Period Weeks    Status Achieved                 Plan - 07/05/20 1145    Clinical Impression Statement Pt recommended for discharge today secondary to  meeting all goals and being pleased with functional progress. Pt has been having some LBP recently so went through her current exercise routine and corrected for form along with adding in a couple of core ex's for  LB. Pt demos improved R knee ROM along with significantly decreased R knee edema. Extensive time spent on education about resumption of normal activities along with when to return if symptoms worsent or recur with VU and agreement.    PT Next Visit Plan discharged this rx    Consulted and Agree with Plan of Care Patient           Patient will benefit from skilled therapeutic intervention in order to improve the following deficits and impairments:     Visit Diagnosis: Chronic pain of right knee  Stiffness of right knee, not elsewhere classified  Localized edema  Muscle weakness (generalized)     Problem List Patient Active Problem List   Diagnosis Date Noted  . Intermediate stage nonexudative age-related macular degeneration of left eye 02/08/2020  . Exudative age-related macular degeneration of right eye with active choroidal neovascularization ([place]) 08/10/2019  . Intermediate stage nonexudative age-related macular degeneration of right eye 08/10/2019  . Serous detachment of retinal pigment epithelium of right eye 08/10/2019  . Degenerative retinal drusen of both eyes 08/10/2019  . Pseudophakia 08/10/2019  . Osteoarthritis of right knee 01/12/2018  . OA (osteoarthritis) of knee 01/10/2018  . Age-related osteoporosis without current pathological fracture 08/12/2016  . Osteoarthritis of both knees 02/24/2016  . Knee effusion, right 02/24/2016  . Patellar luxation 02/24/2016  . Osteoarthritis of both feet 02/24/2016  . Osteoarthritis of both hands 02/24/2016  . Macular degeneration 02/24/2016  . Varicose veins of lower extremities with other complications 84/16/6063   Amador Cunas, PT, DPT Donald Prose Shawan Corella 07/05/2020, 11:49 AM  [place]. [place], [place], [address] Phone: [phone]   Fax:  [phone]  Name: Leighton Luster MRN: [id number] Date of Birth: 1946/03/17

## 2020-08-14 ENCOUNTER — Ambulatory Visit (INDEPENDENT_AMBULATORY_CARE_PROVIDER_SITE_OTHER): Payer: Medicare Other | Admitting: Ophthalmology

## 2020-08-14 ENCOUNTER — Other Ambulatory Visit: Payer: Self-pay

## 2020-08-14 ENCOUNTER — Encounter (INDEPENDENT_AMBULATORY_CARE_PROVIDER_SITE_OTHER): Payer: Self-pay | Admitting: Ophthalmology

## 2020-08-14 DIAGNOSIS — H353122 Nonexudative age-related macular degeneration, left eye, intermediate dry stage: Secondary | ICD-10-CM | POA: Diagnosis not present

## 2020-08-14 DIAGNOSIS — H35721 Serous detachment of retinal pigment epithelium, right eye: Secondary | ICD-10-CM

## 2020-08-14 DIAGNOSIS — H353211 Exudative age-related macular degeneration, right eye, with active choroidal neovascularization: Secondary | ICD-10-CM | POA: Diagnosis not present

## 2020-08-14 MED ORDER — BEVACIZUMAB 2.5 MG/0.1ML IZ SOSY
2.5000 mg | PREFILLED_SYRINGE | INTRAVITREAL | Status: AC | PRN
  Administered 2020-08-14: 2.5 mg via INTRAVITREAL

## 2020-08-14 NOTE — Assessment & Plan Note (Signed)
Today at 57-month follow-up, with history of chronic recurrences.  We will repeat injection today to maintain to prevent recurrences

## 2020-08-14 NOTE — Assessment & Plan Note (Signed)
Resolved as compared to the past

## 2020-08-14 NOTE — Assessment & Plan Note (Signed)
No signs of active CNVM formation left eye

## 2020-08-14 NOTE — Progress Notes (Signed)
08/14/2020     CHIEF COMPLAINT Patient presents for Retina Follow Up (3 Mo F/U OU, poss Avastin OD//Pt reports occasional black "circle" floater OD following injections. Pt sts she is able to see "circle" when reading. Pt sts she is unsure which eye floater is in. Pt denies any other new symptoms OU.)   HISTORY OF PRESENT ILLNESS: Angela Collier is a 75 y.o. female who presents to the clinic today for:   HPI    Retina Follow Up    Diagnosis: Wet AMD   Laterality: right eye   Onset: 3 months ago   Severity: mild   Duration: 3 months   Course: stable   Comments: 3 Mo F/U OU, poss Avastin OD  Pt reports occasional black "circle" floater OD following injections. Pt sts she is able to see "circle" when reading. Pt sts she is unsure which eye floater is in. Pt denies any other new symptoms OU.       Last edited by Angela Collier, [place] on 08/14/2020 10:18 AM. (History)      Referring physician: Mayra Neer, MD [address],  [place] [address]  HISTORICAL INFORMATION:   Selected notes from the MEDICAL RECORD NUMBER       CURRENT MEDICATIONS: Current Outpatient Medications (Ophthalmic Drugs)  Medication Sig  . carboxymethylcellulose (REFRESH PLUS) 0.5 % SOLN Place 1 drop into both eyes 3 (three) times daily as needed (for dry eyes.).   No current facility-administered medications for this visit. (Ophthalmic Drugs)   Current Outpatient Medications (Other)  Medication Sig  . CALCIUM PO Take by mouth.  . Coenzyme Q10 (CO Q 10 PO) Take by mouth.  . COLLAGEN PO Take by mouth.  . lidocaine (XYLOCAINE) 2 % solution Lidocaine Viscous 2 % mucosal solution  GARGLE 5 MLS EVERY 2 HOURS AS NEEDED  . MAGNESIUM PO Take by mouth.  . NON FORMULARY Take 1 Device by mouth at bedtime. EMA oral device for the treatment of macular degeneration  . Omega-3 Fatty Acids (FISH OIL PO) Take by mouth daily.  . Red Yeast Rice Extract (RED YEAST RICE PO) Take by mouth 2 (two)  times daily.  Marland Kitchen Specialty Vitamins Products (COLLAGEN ULTRA) CAPS See admin instructions.  . TURMERIC PO Take by mouth.  Marland Kitchen VITAMIN D PO Take by mouth 2 (two) times daily.  Marland Kitchen VITAMIN E PO Take by mouth.   No current facility-administered medications for this visit. (Other)      REVIEW OF SYSTEMS:    ALLERGIES Allergies  Allergen Reactions  . Rosanil Cleanser [Sulfacetamide Sodium-Sulfur]     Other reaction(s): (Sulfa Drugs) - Itching  . Sulfa Antibiotics Itching  . Sulfasalazine Itching    PAST MEDICAL HISTORY Past Medical History:  Diagnosis Date  . Arthritis    large joint   . Hyperlipidemia   . Macular degeneration   . Osteopenia   . Osteoporosis   . Pneumonia    childhood age 39   . Varicose veins    Past Surgical History:  Procedure Laterality Date  . CATARACT EXTRACTION Right    2011  . FRACTURE SURGERY  2007   BIL wrist  . PARTIAL HYSTERECTOMY    . TOTAL KNEE ARTHROPLASTY Right 01/10/2018   Procedure: RIGHT TOTAL KNEE ARTHROPLASTY;  Surgeon: Angela Arabian, MD;  Location: [facility];  Service: Orthopedics;  Laterality: Right;  . WRIST FRACTURE SURGERY Bilateral 2004    FAMILY HISTORY Family History  Problem Relation Age of Onset  .  Osteoarthritis Mother   . Rheum arthritis Mother   . Cancer Father        liver  . Heart disease Father   . Hyperlipidemia Father   . Hypertension Father   . Heart attack Father   . Other Father        varicose veins  . Hyperlipidemia Sister   . Healthy Son   . Healthy Daughter     SOCIAL HISTORY Social History   Tobacco Use  . Smoking status: Never Smoker  . Smokeless tobacco: Never Used  Vaping Use  . Vaping Use: Never used  Substance Use Topics  . Alcohol use: Yes    Comment: occ  . Drug use: No         OPHTHALMIC EXAM: Base Eye Exam    Visual Acuity (ETDRS)      Right Left   Dist [place] 20/200 20/25 +2   Dist ph [place] 20/30 -1        Tonometry (Tonopen, 10:22 AM)      Right Left   Pressure 16 15        Pupils      Pupils Dark Light Shape React APD   Right PERRL 5 4 Round Brisk None   Left PERRL 5 4 Round Brisk None       Visual Fields (Counting fingers)      Left Right    Full Full       Extraocular Movement      Right Left    Full Full       Neuro/Psych    Oriented x3: Yes   Mood/Affect: Normal       Dilation    Both eyes: 1.0% Mydriacyl, 2.5% Phenylephrine @ 10:22 AM        Slit Lamp and Fundus Exam    External Exam      Right Left   External Normal Normal       Slit Lamp Exam      Right Left   Lids/Lashes Normal Normal   Conjunctiva/Sclera White and quiet White and quiet   Cornea Clear Clear   Anterior Chamber Deep and quiet Deep and quiet   Iris Round and reactive Round and reactive   Lens Posterior chamber intraocular lens Posterior chamber intraocular lens   Anterior Vitreous Normal Normal       Fundus Exam      Right Left   Posterior Vitreous Normal Normal   Disc Normal Normal   C/D Ratio 0.45 0.45   Macula Intermediate age related macular degeneration, Soft drusen, no exudates, Retinal pigment epithelial mottling, no macular thickening, Pigmented atrophy, no hemorrhage Intermediate age related macular degeneration, Soft drusen, no exudates, Retinal pigment epithelial mottling, no macular thickening, Pigmented atrophy, no hemorrhage   Vessels Normal Normal   Periphery Normal Normal          IMAGING AND PROCEDURES  Imaging and Procedures for 08/14/20  OCT, Retina - OU - Both Eyes       Right Eye Quality was good. Scan locations included subfoveal. Central Foveal Thickness: 268. Progression has been stable. Findings include retinal drusen , abnormal foveal contour, no SRF.   Left Eye Quality was good. Scan locations included subfoveal. Central Foveal Thickness: 271. Progression has been stable. Findings include abnormal foveal contour.   Notes OD, overall resolved subretinal fluid and from chronic serous detachment in the past.   Remains resolved at 87-month interval today       Intravitreal Injection,  Pharmacologic Agent - OD - Right Eye       Time Out 08/14/2020. 11:08 AM. Confirmed correct patient, procedure, site, and patient consented.   Anesthesia Topical anesthesia was used. Anesthetic medications included Akten 3.5%.   Procedure Preparation included 5% betadine to ocular surface, 10% betadine to eyelids, Tobramycin 0.3%. A 30 gauge needle was used.   Injection:  2.5 mg Bevacizumab (AVASTIN) 2.5mg /0.47mL SOSY   NDC: 63016-010-93, Lot: [id number]   Route: Intravitreal, Site: Right Eye  Post-op Post injection exam found visual acuity of at least counting fingers. The patient tolerated the procedure well. There were no complications. The patient received written and verbal post procedure care education. Post injection medications were not given.                 ASSESSMENT/PLAN:  Exudative age-related macular degeneration of right eye with active choroidal neovascularization (HCC) Today at 6-month follow-up, with history of chronic recurrences.  We will repeat injection today to maintain to prevent recurrences  Intermediate stage nonexudative age-related macular degeneration of left eye No signs of active CNVM formation left eye  Serous detachment of retinal pigment epithelium of right eye Resolved as compared to the past      ICD-10-CM   1. Exudative age-related macular degeneration of right eye with active choroidal neovascularization (HCC)  H35.3211 OCT, Retina - OU - Both Eyes    Intravitreal Injection, Pharmacologic Agent - OD - Right Eye    bevacizumab (AVASTIN) SOSY 2.5 mg  2. Intermediate stage nonexudative age-related macular degeneration of left eye  H35.3122   3. Serous detachment of retinal pigment epithelium of right eye  H35.721     1.  Vastly improved macular findings OD and resolution of serous component of ARMD CNVM.  OD we will repeat injection Avastin today at 70-month  interval  2.  Dilate OU next and likely injection right eye for maintenance 3 months  3.  Ophthalmic Meds Ordered this visit:  Meds ordered this encounter  Medications  . bevacizumab (AVASTIN) SOSY 2.5 mg       Return in about 3 months (around 11/14/2020) for DILATE OU, AVASTIN OCT, OD.  There are no Patient Instructions on file for this visit.   Explained the diagnoses, plan, and follow up with the patient and they expressed understanding.  Patient expressed understanding of the importance of proper follow up care.   Clent Demark Ike Maragh M.D. Diseases & Surgery of the Retina and Vitreous [facility] [place] 08/14/20     Abbreviations: M myopia (nearsighted); A astigmatism; H hyperopia (farsighted); P presbyopia; Mrx spectacle prescription;  CTL contact lenses; OD right eye; OS left eye; OU both eyes  XT exotropia; ET esotropia; PEK punctate epithelial keratitis; PEE punctate epithelial erosions; DES dry eye syndrome; MGD meibomian gland dysfunction; ATs artificial tears; PFAT's preservative free artificial tears; [place] nuclear sclerotic cataract; PSC posterior subcapsular cataract; ERM epi-retinal membrane; PVD posterior vitreous detachment; RD retinal detachment; DM diabetes mellitus; DR diabetic retinopathy; NPDR non-proliferative diabetic retinopathy; PDR proliferative diabetic retinopathy; CSME clinically significant macular edema; DME diabetic macular edema; dbh dot blot hemorrhages; CWS cotton wool spot; POAG primary open angle glaucoma; C/D cup-to-disc ratio; HVF humphrey visual field; GVF goldmann visual field; OCT optical coherence tomography; IOP intraocular pressure; BRVO Branch retinal vein occlusion; CRVO central retinal vein occlusion; CRAO central retinal artery occlusion; BRAO branch retinal artery occlusion; RT retinal tear; SB scleral buckle; PPV pars plana vitrectomy; VH Vitreous hemorrhage; PRP panretinal laser photocoagulation; IVK intravitreal  kenalog; VMT  vitreomacular traction; MH Macular hole;  NVD neovascularization of the disc; NVE neovascularization elsewhere; AREDS age related eye disease study; ARMD age related macular degeneration; POAG primary open angle glaucoma; EBMD epithelial/anterior basement membrane dystrophy; ACIOL anterior chamber intraocular lens; IOL intraocular lens; PCIOL posterior chamber intraocular lens; Phaco/IOL phacoemulsification with intraocular lens placement; [place] photorefractive keratectomy; LASIK laser assisted in situ keratomileusis; HTN hypertension; DM diabetes mellitus; COPD chronic obstructive pulmonary disease

## 2020-08-15 ENCOUNTER — Encounter (INDEPENDENT_AMBULATORY_CARE_PROVIDER_SITE_OTHER): Payer: Medicare Other | Admitting: Ophthalmology

## 2020-09-17 DIAGNOSIS — D2272 Melanocytic nevi of left lower limb, including hip: Secondary | ICD-10-CM | POA: Diagnosis not present

## 2020-09-17 DIAGNOSIS — D225 Melanocytic nevi of trunk: Secondary | ICD-10-CM | POA: Diagnosis not present

## 2020-09-17 DIAGNOSIS — D2271 Melanocytic nevi of right lower limb, including hip: Secondary | ICD-10-CM | POA: Diagnosis not present

## 2020-09-17 DIAGNOSIS — L570 Actinic keratosis: Secondary | ICD-10-CM | POA: Diagnosis not present

## 2020-09-17 DIAGNOSIS — D2371 Other benign neoplasm of skin of right lower limb, including hip: Secondary | ICD-10-CM | POA: Diagnosis not present

## 2020-09-17 DIAGNOSIS — D1801 Hemangioma of skin and subcutaneous tissue: Secondary | ICD-10-CM | POA: Diagnosis not present

## 2020-09-17 DIAGNOSIS — D220 Melanocytic nevi of lip: Secondary | ICD-10-CM | POA: Diagnosis not present

## 2020-09-17 DIAGNOSIS — L814 Other melanin hyperpigmentation: Secondary | ICD-10-CM | POA: Diagnosis not present

## 2020-09-17 DIAGNOSIS — L821 Other seborrheic keratosis: Secondary | ICD-10-CM | POA: Diagnosis not present

## 2020-11-05 DIAGNOSIS — H903 Sensorineural hearing loss, bilateral: Secondary | ICD-10-CM | POA: Diagnosis not present

## 2020-11-12 DIAGNOSIS — L821 Other seborrheic keratosis: Secondary | ICD-10-CM | POA: Diagnosis not present

## 2020-11-12 DIAGNOSIS — L570 Actinic keratosis: Secondary | ICD-10-CM | POA: Diagnosis not present

## 2020-11-12 DIAGNOSIS — H905 Unspecified sensorineural hearing loss: Secondary | ICD-10-CM | POA: Diagnosis not present

## 2020-11-18 ENCOUNTER — Encounter (INDEPENDENT_AMBULATORY_CARE_PROVIDER_SITE_OTHER): Payer: Medicare Other | Admitting: Ophthalmology

## 2020-11-19 ENCOUNTER — Encounter (INDEPENDENT_AMBULATORY_CARE_PROVIDER_SITE_OTHER): Payer: Medicare Other | Admitting: Ophthalmology

## 2020-11-20 ENCOUNTER — Encounter (INDEPENDENT_AMBULATORY_CARE_PROVIDER_SITE_OTHER): Payer: Self-pay | Admitting: Ophthalmology

## 2020-11-20 ENCOUNTER — Ambulatory Visit (INDEPENDENT_AMBULATORY_CARE_PROVIDER_SITE_OTHER): Payer: Medicare Other | Admitting: Ophthalmology

## 2020-11-20 ENCOUNTER — Other Ambulatory Visit: Payer: Self-pay

## 2020-11-20 DIAGNOSIS — H353122 Nonexudative age-related macular degeneration, left eye, intermediate dry stage: Secondary | ICD-10-CM

## 2020-11-20 DIAGNOSIS — H353211 Exudative age-related macular degeneration, right eye, with active choroidal neovascularization: Secondary | ICD-10-CM

## 2020-11-20 MED ORDER — BEVACIZUMAB 2.5 MG/0.1ML IZ SOSY
2.5000 mg | PREFILLED_SYRINGE | INTRAVITREAL | Status: AC | PRN
  Administered 2020-11-20: 2.5 mg via INTRAVITREAL

## 2020-11-20 NOTE — Progress Notes (Signed)
11/20/2020     CHIEF COMPLAINT Patient presents for Retina Follow Up (3 month fu OU and Avastin OD/Pt states VA OU stable since last visit. Pt denies FOL, floaters, or ocular pain OU. /)   HISTORY OF PRESENT ILLNESS: Angela Collier is a 75 y.o. female who presents to the clinic today for:   HPI     Retina Follow Up           Diagnosis: Wet AMD   Laterality: right eye   Onset: 3 months ago   Severity: mild   Duration: 3 months   Course: stable   Comments: 3 month fu OU and Avastin OD Pt states VA OU stable since last visit. Pt denies FOL, floaters, or ocular pain OU.         Last edited by Kendra Opitz, COA on 11/20/2020 10:12 AM.      Referring physician: Mayra Neer, MD [address],  [place] [address]  HISTORICAL INFORMATION:   Selected notes from the MEDICAL RECORD NUMBER       CURRENT MEDICATIONS: Current Outpatient Medications (Ophthalmic Drugs)  Medication Sig   carboxymethylcellulose (REFRESH PLUS) 0.5 % SOLN Place 1 drop into both eyes 3 (three) times daily as needed (for dry eyes.).   No current facility-administered medications for this visit. (Ophthalmic Drugs)   Current Outpatient Medications (Other)  Medication Sig   CALCIUM PO Take by mouth.   Coenzyme Q10 (CO Q 10 PO) Take by mouth.   COLLAGEN PO Take by mouth.   lidocaine (XYLOCAINE) 2 % solution Lidocaine Viscous 2 % mucosal solution  GARGLE 5 MLS EVERY 2 HOURS AS NEEDED   MAGNESIUM PO Take by mouth.   NON FORMULARY Take 1 Device by mouth at bedtime. EMA oral device for the treatment of macular degeneration   Omega-3 Fatty Acids (FISH OIL PO) Take by mouth daily.   Red Yeast Rice Extract (RED YEAST RICE PO) Take by mouth 2 (two) times daily.   Specialty Vitamins Products (COLLAGEN ULTRA) CAPS See admin instructions.   TURMERIC PO Take by mouth.   VITAMIN D PO Take by mouth 2 (two) times daily.   VITAMIN E PO Take by mouth.   No current facility-administered  medications for this visit. (Other)      REVIEW OF SYSTEMS:    ALLERGIES Allergies  Allergen Reactions   Rosanil Cleanser [Sulfacetamide Sodium-Sulfur]     Other reaction(s): (Sulfa Drugs) - Itching   Sulfa Antibiotics Itching   Sulfasalazine Itching    PAST MEDICAL HISTORY Past Medical History:  Diagnosis Date   Arthritis    large joint    Hyperlipidemia    Macular degeneration    Osteopenia    Osteoporosis    Pneumonia    childhood age 89    Varicose veins    Past Surgical History:  Procedure Laterality Date   CATARACT EXTRACTION Right    2011   FRACTURE SURGERY  2007   BIL wrist   PARTIAL HYSTERECTOMY     TOTAL KNEE ARTHROPLASTY Right 01/10/2018   Procedure: RIGHT TOTAL KNEE ARTHROPLASTY;  Surgeon: Gaynelle Arabian, MD;  Location: [facility];  Service: Orthopedics;  Laterality: Right;   WRIST FRACTURE SURGERY Bilateral 2004    FAMILY HISTORY Family History  Problem Relation Age of Onset   Osteoarthritis Mother    Rheum arthritis Mother    Cancer Father        liver   Heart disease Father  Hyperlipidemia Father    Hypertension Father    Heart attack Father    Other Father        varicose veins   Hyperlipidemia Sister    Healthy Son    Healthy Daughter     SOCIAL HISTORY Social History   Tobacco Use   Smoking status: Never   Smokeless tobacco: Never  Vaping Use   Vaping Use: Never used  Substance Use Topics   Alcohol use: Yes    Comment: occ   Drug use: No         OPHTHALMIC EXAM:  Base Eye Exam     Visual Acuity (ETDRS)       Right Left   Dist [place] 20/200 20/25 +2   Dist ph [place] 20/50 -2          Tonometry (Tonopen, 10:15 AM)       Right Left   Pressure 16 16         Pupils       Pupils Dark Light Shape React APD   Right PERRL 5 4 Round Brisk None   Left PERRL 5 4 Round Brisk None         Visual Fields (Counting fingers)       Left Right    Full Full         Extraocular Movement       Right Left    Full  Full         Neuro/Psych     Oriented x3: Yes   Mood/Affect: Normal         Dilation     Both eyes: 1.0% Mydriacyl, 2.5% Phenylephrine @ 10:15 AM           Slit Lamp and Fundus Exam     External Exam       Right Left   External Normal Normal         Slit Lamp Exam       Right Left   Lids/Lashes Normal Normal   Conjunctiva/Sclera White and quiet White and quiet   Cornea Clear Clear   Anterior Chamber Deep and quiet Deep and quiet   Iris Round and reactive Round and reactive   Lens Posterior chamber intraocular lens Posterior chamber intraocular lens   Anterior Vitreous Normal Normal         Fundus Exam       Right Left   Posterior Vitreous Normal Normal   Disc Normal Normal   C/D Ratio 0.45 0.45   Macula Intermediate age related macular degeneration, Soft drusen, no exudates, Retinal pigment epithelial mottling, no macular thickening, Pigmented atrophy, no hemorrhage Intermediate age related macular degeneration, Soft drusen, no exudates, Retinal pigment epithelial mottling, no macular thickening, Pigmented atrophy, no hemorrhage   Vessels Normal Normal   Periphery Normal Normal            IMAGING AND PROCEDURES  Imaging and Procedures for 11/20/20  OCT, Retina - OU - Both Eyes       Right Eye Quality was good. Scan locations included subfoveal. Central Foveal Thickness: 261. Progression has been stable. Findings include retinal drusen , abnormal foveal contour, no SRF.   Left Eye Quality was good. Scan locations included subfoveal. Central Foveal Thickness: 272. Progression has been stable. Findings include abnormal foveal contour.   Notes OD, overall resolved subretinal fluid and from chronic serous detachment in the past.  Remains resolved at 58-monthinterval today repeat Avastin today injection and evaluate  next in 4 months     Intravitreal Injection, Pharmacologic Agent - OD - Right Eye       Time Out 11/20/2020. 10:50 AM.  Confirmed correct patient, procedure, site, and patient consented.   Anesthesia Topical anesthesia was used. Anesthetic medications included Akten 3.5%.   Procedure Preparation included 5% betadine to ocular surface, 10% betadine to eyelids, Tobramycin 0.3%. A 30 gauge needle was used.   Injection: 2.5 mg bevacizumab 2.5 MG/0.1ML   Route: Intravitreal, Site: Right Eye   NDC: [phone], Lot: [id number]   Post-op Post injection exam found visual acuity of at least counting fingers. The patient tolerated the procedure well. There were no complications. The patient received written and verbal post procedure care education. Post injection medications were not given.              ASSESSMENT/PLAN:  Exudative age-related macular degeneration of right eye with active choroidal neovascularization (HCC) The nature of wet macular degeneration was discussed with the patient.  Forms of therapy reviewed include the use of Anti-VEGF medications injected painlessly into the eye, as well as other possible treatment modalities, including thermal laser therapy. Fellow eye involvement and risks were discussed with the patient. Upon the finding of wet age related macular degeneration, treatment will be offered. The treatment regimen is on a treat as needed basis with the intent to treat if necessary and extend interval of exams when possible. On average 1 out of 6 patients do not need lifetime therapy. However, the risk of recurrent disease is high for a lifetime.  Initially monthly, then periodic, examinations and evaluations will determine whether the next treatment is required on the day of the examination.  Intermediate stage nonexudative age-related macular degeneration of left eye OS no signs of active CNVM     ICD-10-CM   1. Exudative age-related macular degeneration of right eye with active choroidal neovascularization (HCC)  H35.3211 OCT, Retina - OU - Both Eyes    Intravitreal Injection,  Pharmacologic Agent - OD - Right Eye    bevacizumab (AVASTIN) SOSY 2.5 mg    2. Intermediate stage nonexudative age-related macular degeneration of left eye  H35.3122       1.  OD, with history of recurrent CNVM's.  Currently at 46-monthfollow-up interval Stable for some time, will repeat injection OD today to maintain and extend interval of examination next to 459-monthno planned injection OD next  2.  Dilate OU next  3.  Ophthalmic Meds Ordered this visit:  Meds ordered this encounter  Medications   bevacizumab (AVASTIN) SOSY 2.5 mg       Return in about 4 months (around 03/22/2021) for DILATE OU, OCT, COLOR FP.  There are no Patient Instructions on file for this visit.   Explained the diagnoses, plan, and follow up with the patient and they expressed understanding.  Patient expressed understanding of the importance of proper follow up care.   GaClent Demarkankin M.D. Diseases & Surgery of the Retina and Vitreous [facility] [place]8/10/22     Abbreviations: M myopia (nearsighted); A astigmatism; H hyperopia (farsighted); P presbyopia; Mrx spectacle prescription;  CTL contact lenses; OD right eye; OS left eye; OU both eyes  XT exotropia; ET esotropia; PEK punctate epithelial keratitis; PEE punctate epithelial erosions; DES dry eye syndrome; MGD meibomian gland dysfunction; ATs artificial tears; PFAT's preservative free artificial tears; NS[place]. Ann Highlandsuclear sclerotic cataract; PSC posterior subcapsular cataract; ERM epi-retinal membrane; PVD posterior vitreous detachment; RD retinal detachment; DM diabetes  mellitus; DR diabetic retinopathy; NPDR non-proliferative diabetic retinopathy; PDR proliferative diabetic retinopathy; CSME clinically significant macular edema; DME diabetic macular edema; dbh dot blot hemorrhages; CWS cotton wool spot; POAG primary open angle glaucoma; C/D cup-to-disc ratio; HVF humphrey visual field; GVF goldmann visual field; OCT optical coherence  tomography; IOP intraocular pressure; BRVO Branch retinal vein occlusion; CRVO central retinal vein occlusion; CRAO central retinal artery occlusion; BRAO branch retinal artery occlusion; RT retinal tear; SB scleral buckle; PPV pars plana vitrectomy; VH Vitreous hemorrhage; PRP panretinal laser photocoagulation; IVK intravitreal kenalog; VMT vitreomacular traction; MH Macular hole;  NVD neovascularization of the disc; NVE neovascularization elsewhere; AREDS age related eye disease study; ARMD age related macular degeneration; POAG primary open angle glaucoma; EBMD epithelial/anterior basement membrane dystrophy; ACIOL anterior chamber intraocular lens; IOL intraocular lens; PCIOL posterior chamber intraocular lens; Phaco/IOL phacoemulsification with intraocular lens placement; [place] photorefractive keratectomy; LASIK laser assisted in situ keratomileusis; HTN hypertension; DM diabetes mellitus; COPD chronic obstructive pulmonary disease

## 2020-11-20 NOTE — Assessment & Plan Note (Signed)
OS no signs of active CNVM 

## 2020-11-20 NOTE — Assessment & Plan Note (Signed)

## 2021-01-07 DIAGNOSIS — E7800 Pure hypercholesterolemia, unspecified: Secondary | ICD-10-CM | POA: Diagnosis not present

## 2021-01-07 DIAGNOSIS — M1990 Unspecified osteoarthritis, unspecified site: Secondary | ICD-10-CM | POA: Diagnosis not present

## 2021-01-07 DIAGNOSIS — R7301 Impaired fasting glucose: Secondary | ICD-10-CM | POA: Diagnosis not present

## 2021-01-07 DIAGNOSIS — M810 Age-related osteoporosis without current pathological fracture: Secondary | ICD-10-CM | POA: Diagnosis not present

## 2021-01-07 DIAGNOSIS — Z0000 Encounter for general adult medical examination without abnormal findings: Secondary | ICD-10-CM | POA: Diagnosis not present

## 2021-01-24 DIAGNOSIS — Z1231 Encounter for screening mammogram for malignant neoplasm of breast: Secondary | ICD-10-CM | POA: Diagnosis not present

## 2021-03-26 ENCOUNTER — Encounter (INDEPENDENT_AMBULATORY_CARE_PROVIDER_SITE_OTHER): Payer: Self-pay | Admitting: Ophthalmology

## 2021-03-26 ENCOUNTER — Other Ambulatory Visit: Payer: Self-pay

## 2021-03-26 ENCOUNTER — Ambulatory Visit (INDEPENDENT_AMBULATORY_CARE_PROVIDER_SITE_OTHER): Payer: Medicare Other | Admitting: Ophthalmology

## 2021-03-26 DIAGNOSIS — H353211 Exudative age-related macular degeneration, right eye, with active choroidal neovascularization: Secondary | ICD-10-CM | POA: Diagnosis not present

## 2021-03-26 DIAGNOSIS — H353114 Nonexudative age-related macular degeneration, right eye, advanced atrophic with subfoveal involvement: Secondary | ICD-10-CM | POA: Diagnosis not present

## 2021-03-26 DIAGNOSIS — H353212 Exudative age-related macular degeneration, right eye, with inactive choroidal neovascularization: Secondary | ICD-10-CM

## 2021-03-26 NOTE — Assessment & Plan Note (Signed)
Regressed CNVM into geographic atrophy

## 2021-03-26 NOTE — Assessment & Plan Note (Signed)
Subfoveal atrophy accounts for the residual vision.  Also accounts for lower likelihood of recurrence of CNVM

## 2021-03-26 NOTE — Progress Notes (Signed)
03/26/2021     CHIEF COMPLAINT Patient presents for  Chief Complaint  Patient presents with   Retina Follow Up      HISTORY OF PRESENT ILLNESS: Angela Collier is a 75 y.o. female who presents to the clinic today for:   HPI     Retina Follow Up           Diagnosis: Wet AMD   Laterality: right eye   Onset: 4 months ago   Severity: mild   Duration: 4 months   Course: stable         Comments   4 month fu ou and oct and fp Pt states VA OU stable since last visit. Pt denies FOL, floaters, or ocular pain OU.  Pt states, "My vision is the same."      Last edited by Kendra Opitz, COA on 03/26/2021 10:47 AM.      Referring physician: Mayra Neer, MD [address],  [place] [id number]  HISTORICAL INFORMATION:   Selected notes from the MEDICAL RECORD NUMBER       CURRENT MEDICATIONS: Current Outpatient Medications (Ophthalmic Drugs)  Medication Sig   carboxymethylcellulose (REFRESH PLUS) 0.5 % SOLN Place 1 drop into both eyes 3 (three) times daily as needed (for dry eyes.).   No current facility-administered medications for this visit. (Ophthalmic Drugs)   Current Outpatient Medications (Other)  Medication Sig   CALCIUM PO Take by mouth.   Coenzyme Q10 (CO Q 10 PO) Take by mouth.   COLLAGEN PO Take by mouth.   lidocaine (XYLOCAINE) 2 % solution Lidocaine Viscous 2 % mucosal solution  GARGLE 5 MLS EVERY 2 HOURS AS NEEDED   MAGNESIUM PO Take by mouth.   NON FORMULARY Take 1 Device by mouth at bedtime. EMA oral device for the treatment of macular degeneration   Omega-3 Fatty Acids (FISH OIL PO) Take by mouth daily.   Red Yeast Rice Extract (RED YEAST RICE PO) Take by mouth 2 (two) times daily.   Specialty Vitamins Products (COLLAGEN ULTRA) CAPS See admin instructions.   TURMERIC PO Take by mouth.   VITAMIN D PO Take by mouth 2 (two) times daily.   VITAMIN E PO Take by mouth.   No current facility-administered medications for  this visit. (Other)      REVIEW OF SYSTEMS:    ALLERGIES Allergies  Allergen Reactions   Rosanil Cleanser [Sulfacetamide Sodium-Sulfur]     Other reaction(s): (Sulfa Drugs) - Itching   Sulfa Antibiotics Itching   Sulfasalazine Itching    PAST MEDICAL HISTORY Past Medical History:  Diagnosis Date   Arthritis    large joint    Hyperlipidemia    Macular degeneration    Osteopenia    Osteoporosis    Pneumonia    childhood age 20    Varicose veins    Past Surgical History:  Procedure Laterality Date   CATARACT EXTRACTION Right    2011   FRACTURE SURGERY  2007   BIL wrist   PARTIAL HYSTERECTOMY     TOTAL KNEE ARTHROPLASTY Right 01/10/2018   Procedure: RIGHT TOTAL KNEE ARTHROPLASTY;  Surgeon: Gaynelle Arabian, MD;  Location: [facility];  Service: Orthopedics;  Laterality: Right;   WRIST FRACTURE SURGERY Bilateral 2004    FAMILY HISTORY Family History  Problem Relation Age of Onset   Osteoarthritis Mother    Rheum arthritis Mother    Cancer Father        liver   Heart disease  Father    Hyperlipidemia Father    Hypertension Father    Heart attack Father    Other Father        varicose veins   Hyperlipidemia Sister    Healthy Son    Healthy Daughter     SOCIAL HISTORY Social History   Tobacco Use   Smoking status: Never   Smokeless tobacco: Never  Vaping Use   Vaping Use: Never used  Substance Use Topics   Alcohol use: Yes    Comment: occ   Drug use: No         OPHTHALMIC EXAM:  Base Eye Exam     Visual Acuity (ETDRS)       Right Left   Dist [place] 20/100 20/25 -2   Dist ph [place] 20/40 +2          Tonometry (Tonopen, 10:51 AM)       Right Left   Pressure 15 14         Pupils       Pupils Shape React APD   Right PERRL Round Minimal None   Left PERRL Round Minimal None         Visual Fields (Counting fingers)       Left Right    Full Full         Extraocular Movement       Right Left    Full Full         Neuro/Psych      Oriented x3: Yes   Mood/Affect: Normal         Dilation     Both eyes: 1.0% Mydriacyl, 2.5% Phenylephrine @ 10:51 AM           Slit Lamp and Fundus Exam     External Exam       Right Left   External Normal Normal         Slit Lamp Exam       Right Left   Lids/Lashes Normal Normal   Conjunctiva/Sclera White and quiet White and quiet   Cornea Clear Clear   Anterior Chamber Deep and quiet Deep and quiet   Iris Round and reactive Round and reactive   Lens Posterior chamber intraocular lens Posterior chamber intraocular lens   Anterior Vitreous Normal Normal         Fundus Exam       Right Left   Posterior Vitreous Normal Normal   Disc Normal Normal   C/D Ratio 0.45 0.45   Macula Intermediate age related macular degeneration, Soft drusen, no exudates, Retinal pigment epithelial mottling, no macular thickening, Pigmented atrophy with geographic atrophy centrally in the FAZ, no hemorrhage Intermediate age related macular degeneration, Soft drusen, no exudates, Retinal pigment epithelial mottling, no macular thickening, Pigmented atrophy, no hemorrhage   Vessels Normal Normal   Periphery Normal Normal            IMAGING AND PROCEDURES  Imaging and Procedures for 03/26/21  OCT, Retina - OU - Both Eyes       Right Eye Quality was good. Scan locations included subfoveal. Central Foveal Thickness: 264. Progression has been stable. Findings include retinal drusen , abnormal foveal contour, no SRF.   Left Eye Quality was good. Scan locations included subfoveal. Central Foveal Thickness: 266. Progression has improved. Findings include abnormal foveal contour.   Notes OD, overall resolved subretinal fluid and from chronic serous detachment in the past.  Today at 4 months follow-up, with no progression  of symptoms.  Outer retinal atrophy by OCT accounts for acuity.  Loss of ellipsoid layer.  No sign of CNVM OU     Color Fundus Photography Optos - OU - Both  Eyes       Right Eye Progression has been stable. Disc findings include normal observations. Macula : geographic atrophy, drusen. Vessels : normal observations. Periphery : normal observations.   Left Eye Progression has been stable. Disc findings include normal observations. Macula : drusen. Vessels : normal observations. Periphery : normal observations.              ASSESSMENT/PLAN:  Exudative age-related macular degeneration of right eye with inactive choroidal neovascularization (HCC) Regressed CNVM into geographic atrophy  Advanced nonexudative age-related macular degeneration of right eye with subfoveal involvement Subfoveal atrophy accounts for the residual vision.  Also accounts for lower likelihood of recurrence of CNVM     ICD-10-CM   1. Exudative age-related macular degeneration of right eye with active choroidal neovascularization (HCC)  H35.3211 OCT, Retina - OU - Both Eyes    Color Fundus Photography Optos - OU - Both Eyes    2. Exudative age-related macular degeneration of right eye with inactive choroidal neovascularization ([place])  H35.3212     3. Advanced nonexudative age-related macular degeneration of right eye with subfoveal involvement  H35.3114       1.  No active wet AMD OU.  Chronic serous retinal detachment subfoveal OD has resolved.  Currently at 4 months post most recent injection we will observe today  2.  3.  Ophthalmic Meds Ordered this visit:  No orders of the defined types were placed in this encounter.      Return in about 3 months (around 06/24/2021) for DILATE OU, OCT, COLOR FP.  Patient Instructions  Patient to self monitor her at home on a one eye basis (monocular) for signs of visual change or distortion and should they occurred promptly notify the office   Explained the diagnoses, plan, and follow up with the patient and they expressed understanding.  Patient expressed understanding of the importance of proper follow up care.    Clent Demark Nicholes Hibler M.D. Diseases & Surgery of the Retina and Vitreous Retina [facility] Diabetic [place] 03/26/21     Abbreviations: M myopia (nearsighted); A astigmatism; H hyperopia (farsighted); P presbyopia; Mrx spectacle prescription;  CTL contact lenses; OD right eye; OS left eye; OU both eyes  XT exotropia; ET esotropia; PEK punctate epithelial keratitis; PEE punctate epithelial erosions; DES dry eye syndrome; MGD meibomian gland dysfunction; ATs artificial tears; PFAT's preservative free artificial tears; [place] nuclear sclerotic cataract; PSC posterior subcapsular cataract; ERM epi-retinal membrane; PVD posterior vitreous detachment; RD retinal detachment; DM diabetes mellitus; DR diabetic retinopathy; NPDR non-proliferative diabetic retinopathy; PDR proliferative diabetic retinopathy; CSME clinically significant macular edema; DME diabetic macular edema; dbh dot blot hemorrhages; CWS cotton wool spot; POAG primary open angle glaucoma; C/D cup-to-disc ratio; HVF humphrey visual field; GVF goldmann visual field; OCT optical coherence tomography; IOP intraocular pressure; BRVO Branch retinal vein occlusion; CRVO central retinal vein occlusion; CRAO central retinal artery occlusion; BRAO branch retinal artery occlusion; RT retinal tear; SB scleral buckle; PPV pars plana vitrectomy; VH Vitreous hemorrhage; PRP panretinal laser photocoagulation; IVK intravitreal kenalog; VMT vitreomacular traction; MH Macular hole;  NVD neovascularization of the disc; NVE neovascularization elsewhere; AREDS age related eye disease study; ARMD age related macular degeneration; POAG primary open angle glaucoma; EBMD epithelial/anterior basement membrane dystrophy; ACIOL anterior chamber intraocular lens; IOL intraocular lens;  PCIOL posterior chamber intraocular lens; Phaco/IOL phacoemulsification with intraocular lens placement; [place] photorefractive keratectomy; LASIK laser assisted in situ keratomileusis; HTN hypertension; DM  diabetes mellitus; COPD chronic obstructive pulmonary disease

## 2021-03-26 NOTE — Patient Instructions (Signed)
Patient to self monitor her at home on a one eye basis (monocular) for signs of visual change or distortion and should they occurred promptly notify the office

## 2021-04-24 ENCOUNTER — Telehealth: Payer: Self-pay

## 2021-04-24 NOTE — Telephone Encounter (Signed)
Patient may contact hand rehab center for ring splints.  I do not know of any other resources.

## 2021-04-24 NOTE — Telephone Encounter (Signed)
Patient left a voicemail stating she has had silver ring splints and tried to reorder more, but the company she orders from is now out of business.  Patient requested a return call.

## 2021-04-28 NOTE — Telephone Encounter (Signed)
Left message to advise patient per Dr. Estanislado Pandy she may contact hand rehab center for ring splints.  I do not know of any other resources.

## 2021-05-26 NOTE — Progress Notes (Deleted)
Office Visit Note  Patient: Angela Collier             Date of Birth: 1946-01-29           MRN: [id number]             PCP: Mayra Neer, MD Referring: Mayra Neer, MD Visit Date: 05/28/2021 Occupation: @GUAROCC @  Subjective:  No chief complaint on file.   History of Present Illness: Angela Collier is a 76 y.o. female ***   Activities of Daily Living:  Patient reports morning stiffness for 0 minutes.   Patient Denies nocturnal pain.  Difficulty dressing/grooming: Denies Difficulty climbing stairs: Denies Difficulty getting out of chair: Denies Difficulty using hands for taps, buttons, cutlery, and/or writing: Reports  Review of Systems  Constitutional:  Positive for fatigue.  HENT:  Negative for mouth sores, mouth dryness and nose dryness.   Eyes:  Negative for pain, itching and dryness.  Respiratory:  Negative for shortness of breath and difficulty breathing.   Cardiovascular:  Negative for chest pain and palpitations.  Gastrointestinal:  Negative for blood in stool, constipation and diarrhea.  Endocrine: Negative for increased urination.  Genitourinary:  Negative for difficulty urinating.  Musculoskeletal:  Positive for joint pain and joint pain. Negative for joint swelling, myalgias, morning stiffness, muscle tenderness and myalgias.  Skin:  Positive for rash. Negative for color change.  Allergic/Immunologic: Negative for susceptible to infections.  Neurological:  Negative for dizziness, numbness, headaches, memory loss and weakness.  Hematological:  Negative for bruising/bleeding tendency.  Psychiatric/Behavioral:  Negative for confusion.    PMFS History:  Patient Active Problem List   Diagnosis Date Noted   Intermediate stage nonexudative age-related macular degeneration of left eye 02/08/2020   Exudative age-related macular degeneration of right eye with inactive choroidal neovascularization ([place]) 08/10/2019   Advanced nonexudative age-related  macular degeneration of right eye with subfoveal involvement 08/10/2019   Serous detachment of retinal pigment epithelium of right eye 08/10/2019   Degenerative retinal drusen of both eyes 08/10/2019   Pseudophakia 08/10/2019   Osteoarthritis of right knee 01/12/2018   OA (osteoarthritis) of knee 01/10/2018   Age-related osteoporosis without current pathological fracture 08/12/2016   Osteoarthritis of both knees 02/24/2016   Knee effusion, right 02/24/2016   Patellar luxation 02/24/2016   Osteoarthritis of both feet 02/24/2016   Osteoarthritis of both hands 02/24/2016   Macular degeneration 02/24/2016   Varicose veins of lower extremities with other complications 67/03/4579    Past Medical History:  Diagnosis Date   Arthritis    large joint    Hyperlipidemia    Macular degeneration    Osteopenia    Osteoporosis    Pneumonia    childhood age 11    Varicose veins     Family History  Problem Relation Age of Onset   Osteoarthritis Mother    Rheum arthritis Mother    Cancer Father        liver   Heart disease Father    Hyperlipidemia Father    Hypertension Father    Heart attack Father    Other Father        varicose veins   Hyperlipidemia Sister    Healthy Son    Healthy Daughter    Past Surgical History:  Procedure Laterality Date   CATARACT EXTRACTION Right    2011   FRACTURE SURGERY  2007   BIL wrist   PARTIAL HYSTERECTOMY     TOTAL KNEE ARTHROPLASTY Right 01/10/2018   Procedure: RIGHT TOTAL  KNEE ARTHROPLASTY;  Surgeon: Gaynelle Arabian, MD;  Location: [facility];  Service: Orthopedics;  Laterality: Right;   WRIST FRACTURE SURGERY Bilateral 2004   Social History   Social History Narrative   Not on file   Immunization History  Administered Date(s) Administered   PFIZER(Purple Top)SARS-COV-2 Vaccination 05/20/2019, 06/13/2019, 02/07/2020     Objective: Vital Signs: There were no vitals taken for this visit.   Physical Exam    Musculoskeletal Exam: ***  CDAI Exam: CDAI Score: -- Patient Global: --; Provider Global: -- Swollen: --; Tender: -- Joint Exam 05/28/2021   No joint exam has been documented for this visit   There is currently no information documented on the homunculus. Go to the Rheumatology activity and complete the homunculus joint exam.  Investigation: No additional findings.  Imaging: No results found.  Recent Labs: Lab Results  Component Value Date   WBC 8.2 01/12/2018   HGB 11.0 (L) 01/12/2018   PLT 130 (L) 01/12/2018   NA 143 01/12/2018   K 4.2 01/12/2018   CL 108 01/12/2018   CO2 27 01/12/2018   GLUCOSE 112 (H) 01/12/2018   BUN 17 01/12/2018   CREATININE 0.70 01/12/2018   BILITOT 1.1 01/05/2018   ALKPHOS 44 01/05/2018   AST 26 01/05/2018   ALT 24 01/05/2018   PROT 6.9 01/05/2018   ALBUMIN 4.6 01/05/2018   CALCIUM 8.7 (L) 01/12/2018   GFRAA >60 01/12/2018    Speciality Comments: No specialty comments available.  Procedures:  No procedures performed Allergies: Rosanil cleanser [sulfacetamide sodium-sulfur], Sulfa antibiotics, and Sulfasalazine   Assessment / Plan:     Visit Diagnoses: No diagnosis found.  Orders: No orders of the defined types were placed in this encounter.  No orders of the defined types were placed in this encounter.   Face-to-face time spent with patient was *** minutes. Greater than 50% of time was spent in counseling and coordination of care.  Follow-Up Instructions: No follow-ups on file.   Earnestine Mealing, CMA  Note - This record has been created using Editor, commissioning.  Chart creation errors have been sought, but may not always  have been located. Such creation errors do not reflect on  the standard of medical care.

## 2021-05-28 ENCOUNTER — Ambulatory Visit: Payer: Medicare Other | Admitting: Rheumatology

## 2021-05-28 ENCOUNTER — Encounter: Payer: Self-pay | Admitting: Rheumatology

## 2021-05-28 ENCOUNTER — Other Ambulatory Visit: Payer: Self-pay

## 2021-05-28 VITALS — BP 137/92 | HR 76 | Ht 63.0 in | Wt 145.4 lb

## 2021-05-28 DIAGNOSIS — M19072 Primary osteoarthritis, left ankle and foot: Secondary | ICD-10-CM | POA: Diagnosis not present

## 2021-05-28 DIAGNOSIS — M19041 Primary osteoarthritis, right hand: Secondary | ICD-10-CM | POA: Diagnosis not present

## 2021-05-28 DIAGNOSIS — M1712 Unilateral primary osteoarthritis, left knee: Secondary | ICD-10-CM

## 2021-05-28 DIAGNOSIS — S83006D Unspecified dislocation of unspecified patella, subsequent encounter: Secondary | ICD-10-CM

## 2021-05-28 DIAGNOSIS — M810 Age-related osteoporosis without current pathological fracture: Secondary | ICD-10-CM

## 2021-05-28 DIAGNOSIS — M19071 Primary osteoarthritis, right ankle and foot: Secondary | ICD-10-CM

## 2021-05-28 DIAGNOSIS — M1990 Unspecified osteoarthritis, unspecified site: Secondary | ICD-10-CM | POA: Diagnosis not present

## 2021-05-28 DIAGNOSIS — Z96651 Presence of right artificial knee joint: Secondary | ICD-10-CM

## 2021-05-28 DIAGNOSIS — M19042 Primary osteoarthritis, left hand: Secondary | ICD-10-CM

## 2021-05-28 NOTE — Progress Notes (Signed)
Office Visit Note  Patient: Angela Collier             Date of Birth: 1946/01/22           MRN: [id number]             PCP: Mayra Neer, MD Referring: Mayra Neer, MD Visit Date: 05/28/2021 Occupation: @GUAROCC @  Subjective:  Pain in both hands and knees  History of Present Illness: Angela Collier is a 76 y.o. female with history of inflammatory osteoarthritis.  She has been doing well using natural supplements.  She has noticed increased subluxation of PIP and DIP joints.  She has been wearing splints on her fingers which helped to some extent.  Her right total knee replacement is doing well.  She has some discomfort in the left knee joint off and on.  She states few months ago she developed pain and discomfort in her left ankle joint for which she went to acupuncture and the symptoms resolved.  She still practices yoga on a regular basis and does a lot of stretching exercises.  Activities of Daily Living:  Patient reports morning stiffness for 0 minute.   Patient Denies nocturnal pain.  Difficulty dressing/grooming: Denies Difficulty climbing stairs: Reports Difficulty getting out of chair: Reports Difficulty using hands for taps, buttons, cutlery, and/or writing: Reports  Review of Systems  Constitutional:  Positive for fatigue. Negative for night sweats, weight gain and weight loss.  HENT:  Negative for mouth sores, trouble swallowing, trouble swallowing, mouth dryness and nose dryness.   Eyes:  Negative for pain, redness, visual disturbance and dryness.  Respiratory:  Negative for cough, shortness of breath and difficulty breathing.   Cardiovascular:  Negative for chest pain, palpitations, hypertension, irregular heartbeat and swelling in legs/feet.  Gastrointestinal:  Negative for blood in stool, constipation and diarrhea.  Endocrine: Negative for increased urination.  Genitourinary:  Negative for vaginal dryness.  Musculoskeletal:  Positive for joint pain and  joint pain. Negative for joint swelling, myalgias, muscle weakness, morning stiffness, muscle tenderness and myalgias.  Skin:  Negative for color change, hair loss, skin tightness, ulcers and sensitivity to sunlight.  Allergic/Immunologic: Negative for susceptible to infections.  Neurological:  Negative for dizziness, memory loss, night sweats and weakness.  Hematological:  Negative for swollen glands.  Psychiatric/Behavioral:  Negative for depressed mood and sleep disturbance. The patient is not nervous/anxious.    PMFS History:  Patient Active Problem List   Diagnosis Date Noted   Intermediate stage nonexudative age-related macular degeneration of left eye 02/08/2020   Exudative age-related macular degeneration of right eye with inactive choroidal neovascularization ([place]) 08/10/2019   Advanced nonexudative age-related macular degeneration of right eye with subfoveal involvement 08/10/2019   Serous detachment of retinal pigment epithelium of right eye 08/10/2019   Degenerative retinal drusen of both eyes 08/10/2019   Pseudophakia 08/10/2019   Osteoarthritis of right knee 01/12/2018   OA (osteoarthritis) of knee 01/10/2018   Age-related osteoporosis without current pathological fracture 08/12/2016   Osteoarthritis of both knees 02/24/2016   Knee effusion, right 02/24/2016   Patellar luxation 02/24/2016   Osteoarthritis of both feet 02/24/2016   Osteoarthritis of both hands 02/24/2016   Macular degeneration 02/24/2016   Varicose veins of lower extremities with other complications 28/41/3244    Past Medical History:  Diagnosis Date   Arthritis    large joint    Hyperlipidemia    Macular degeneration    Osteopenia    Osteoporosis    Pneumonia  childhood age 89    Varicose veins     Family History  Problem Relation Age of Onset   Osteoarthritis Mother    Rheum arthritis Mother    Cancer Father        liver   Heart disease Father    Hyperlipidemia Father    Hypertension  Father    Heart attack Father    Other Father        varicose veins   Hyperlipidemia Sister    Healthy Daughter    Healthy Son    Past Surgical History:  Procedure Laterality Date   CATARACT EXTRACTION Right    2011   FRACTURE SURGERY  2007   BIL wrist   PARTIAL HYSTERECTOMY     TOTAL KNEE ARTHROPLASTY Right 01/10/2018   Procedure: RIGHT TOTAL KNEE ARTHROPLASTY;  Surgeon: Gaynelle Arabian, MD;  Location: [facility];  Service: Orthopedics;  Laterality: Right;   WRIST FRACTURE SURGERY Bilateral 2004   Social History   Social History Narrative   Not on file   Immunization History  Administered Date(s) Administered   PFIZER(Purple Top)SARS-COV-2 Vaccination 05/20/2019, 06/13/2019, 02/07/2020     Objective: Vital Signs: BP (!) 137/92 (BP Location: Left Arm, Patient Position: Sitting, Cuff Size: Normal)    Pulse 76    Ht 5\' 3"  (1.6 m)    Wt 145 lb 6.4 oz (66 kg)    BMI 25.76 kg/m    Physical Exam Vitals and nursing note reviewed.  Constitutional:      Appearance: She is well-developed.  HENT:     Head: Normocephalic and atraumatic.  Eyes:     Conjunctiva/sclera: Conjunctivae normal.  Cardiovascular:     Rate and Rhythm: Normal rate and regular rhythm.     Heart sounds: Normal heart sounds.  Pulmonary:     Effort: Pulmonary effort is normal.     Breath sounds: Normal breath sounds.  Abdominal:     General: Bowel sounds are normal.     Palpations: Abdomen is soft.  Musculoskeletal:     Cervical back: Normal range of motion.  Lymphadenopathy:     Cervical: No cervical adenopathy.  Skin:    General: Skin is warm and dry.     Capillary Refill: Capillary refill takes less than 2 seconds.     Comments: Lipoma was noted over her left shoulder.  Neurological:     Mental Status: She is alert and oriented to person, place, and time.  Psychiatric:        Behavior: Behavior normal.     Musculoskeletal Exam: C-spine was in good range of motion.  Shoulder joints, elbow joints,  wrist joints, MCPs PIPs and DIPs with good range of motion.  She had contracture of her right fourth PIP joint and subluxation of her right fifth finger DIP joint.  Left first and fourth DIP subluxation was noted.  No synovitis was noted.  Hip joints in good range of motion.  Right total knee replacement is doing well.  Left knee joint with good range of motion without any warmth or swelling.  There was no synovitis over ankles or MTPs.  She has severe arthritis in her feet with overcrowding of the toes.  She has pes cavus and hammertoes.  CDAI Exam: CDAI Score: -- Patient Global: --; Provider Global: -- Swollen: --; Tender: -- Joint Exam 05/28/2021   No joint exam has been documented for this visit   There is currently no information documented on the homunculus. Go to the Rheumatology  activity and complete the homunculus joint exam.  Investigation: No additional findings.  Imaging: No results found.  Recent Labs: Lab Results  Component Value Date   WBC 8.2 01/12/2018   HGB 11.0 (L) 01/12/2018   PLT 130 (L) 01/12/2018   NA 143 01/12/2018   K 4.2 01/12/2018   CL 108 01/12/2018   CO2 27 01/12/2018   GLUCOSE 112 (H) 01/12/2018   BUN 17 01/12/2018   CREATININE 0.70 01/12/2018   BILITOT 1.1 01/05/2018   ALKPHOS 44 01/05/2018   AST 26 01/05/2018   ALT 24 01/05/2018   PROT 6.9 01/05/2018   ALBUMIN 4.6 01/05/2018   CALCIUM 8.7 (L) 01/12/2018   GFRAA >60 01/12/2018    Speciality Comments: No specialty comments available.  Procedures:  No procedures performed Allergies: Rosanil cleanser [sulfacetamide sodium-sulfur], Sulfa antibiotics, and Sulfasalazine   Assessment / Plan:     Visit Diagnoses: Inflammatory arthritis - Seronegative, history of recurrent knee effusions.  She has had no recurrence of effusion since her right total knee replacement.  She has minimal discomfort in her left knee joint.  Status post total knee replacement, right - January 09, 2018 by Dr. Maureen Ralphs.    Primary osteoarthritis of both hands - she has severe osteoarthritis in her hands with DIP subluxation.  She has been wearing rings splints which are helpful only to some extent.  I had a detailed discussion regarding possible surgery for DIP joints.  She was in agreement.  I will refer her to Dr. Amedeo Plenty for evaluation.  Primary osteoarthritis of left knee-doing well with no synovitis.  Primary osteoarthritis of both feet - she has severe osteoarthritis in her feet with MTP subluxation.  Proper fitting shoes with arch support were discussed.  Patellar dislocation, unspecified laterality, subsequent encounter  Age-related osteoporosis without current pathological fracture - DXA on July 29, 2016 showed T score of -2.5 at [facility].  Use of calcium rich diet and vitamin D was discussed.  She practices yoga and is very active.  Orders: Orders Placed This Encounter  Procedures   Ambulatory referral to Orthopedics   No orders of the defined types were placed in this encounter.    Follow-Up Instructions: Return in about 1 year (around 05/28/2022) for Osteoarthritis.   Bo Merino, MD  Note - This record has been created using Editor, commissioning.  Chart creation errors have been sought, but may not always  have been located. Such creation errors do not reflect on  the standard of medical care.

## 2021-06-11 ENCOUNTER — Ambulatory Visit: Payer: Medicare Other | Admitting: Rheumatology

## 2021-06-24 ENCOUNTER — Other Ambulatory Visit: Payer: Self-pay

## 2021-06-24 ENCOUNTER — Ambulatory Visit (INDEPENDENT_AMBULATORY_CARE_PROVIDER_SITE_OTHER): Payer: Medicare Other | Admitting: Ophthalmology

## 2021-06-24 ENCOUNTER — Encounter (INDEPENDENT_AMBULATORY_CARE_PROVIDER_SITE_OTHER): Payer: Self-pay | Admitting: Ophthalmology

## 2021-06-24 DIAGNOSIS — H353212 Exudative age-related macular degeneration, right eye, with inactive choroidal neovascularization: Secondary | ICD-10-CM

## 2021-06-24 DIAGNOSIS — H353211 Exudative age-related macular degeneration, right eye, with active choroidal neovascularization: Secondary | ICD-10-CM | POA: Diagnosis not present

## 2021-06-24 DIAGNOSIS — H3322 Serous retinal detachment, left eye: Secondary | ICD-10-CM | POA: Diagnosis not present

## 2021-06-24 DIAGNOSIS — H353221 Exudative age-related macular degeneration, left eye, with active choroidal neovascularization: Secondary | ICD-10-CM | POA: Diagnosis not present

## 2021-06-24 DIAGNOSIS — H353114 Nonexudative age-related macular degeneration, right eye, advanced atrophic with subfoveal involvement: Secondary | ICD-10-CM

## 2021-06-24 DIAGNOSIS — H35721 Serous detachment of retinal pigment epithelium, right eye: Secondary | ICD-10-CM

## 2021-06-24 MED ORDER — BEVACIZUMAB 2.5 MG/0.1ML IZ SOSY
2.5000 mg | PREFILLED_SYRINGE | INTRAVITREAL | Status: AC | PRN
  Administered 2021-06-24: 2.5 mg via INTRAVITREAL

## 2021-06-24 NOTE — Assessment & Plan Note (Signed)
Now at 7 months post last injection OD, and no sign of recurrence OD ?

## 2021-06-24 NOTE — Assessment & Plan Note (Signed)
No sign of recurrence OD ?

## 2021-06-24 NOTE — Assessment & Plan Note (Signed)
OS, new signs of serous retinal detachment temporal FAZ, likely CNVM, will commence with therapy today commence with Avastin OS today ?

## 2021-06-24 NOTE — Assessment & Plan Note (Signed)
Accounts for acuity OD 

## 2021-06-24 NOTE — Progress Notes (Signed)
? ? ?06/24/2021 ? ?  ? ?CHIEF COMPLAINT ?Patient presents for  ?Chief Complaint  ?Patient presents with  ? Macular Degeneration  ? ? ? ? ?HISTORY OF PRESENT ILLNESS: ?Angela Collier is a 76 y.o. female who presents to the clinic today for:  ? ?HPI   ?3 mos fu Dilate OU, OCT, FP. ?Patient states vision is stable and unchanged since last visit. Denies any new floaters or FOL. ? ?Last edited by Laurin Coder on 06/24/2021 10:31 AM.  ?  ? ? ?Referring physician: ?Mayra Neer, MD ?[place] ?[address] ?[place],  [place] [address] ? ?HISTORICAL INFORMATION:  ? ?Selected notes from the [facility] ?  ?   ? ?CURRENT MEDICATIONS: ?Current Outpatient Medications (Ophthalmic Drugs)  ?Medication Sig  ? carboxymethylcellulose (REFRESH PLUS) 0.5 % SOLN Place 1 drop into both eyes 3 (three) times daily as needed (for dry eyes.).  ? ?No current facility-administered medications for this visit. (Ophthalmic Drugs)  ? ?Current Outpatient Medications (Other)  ?Medication Sig  ? CALCIUM PO Take by mouth.  ? Coenzyme Q10 (CO Q 10 PO) Take by mouth.  ? COLLAGEN PO Take by mouth. (Patient not taking: Reported on 05/28/2021)  ? lidocaine (XYLOCAINE) 2 % solution Lidocaine Viscous 2 % mucosal solution ? GARGLE 5 MLS EVERY 2 HOURS AS NEEDED  ? MAGNESIUM PO Take by mouth.  ? NON FORMULARY Take 1 Device by mouth at bedtime. EMA oral device for the treatment of macular degeneration  ? Omega-3 Fatty Acids (FISH OIL PO) Take by mouth daily.  ? Red Yeast Rice Extract (RED YEAST RICE PO) Take by mouth 2 (two) times daily.  ? Specialty Vitamins Products (COLLAGEN ULTRA) CAPS See admin instructions. (Patient not taking: Reported on 05/28/2021)  ? TURMERIC PO Take by mouth.  ? VITAMIN D PO Take by mouth 2 (two) times daily.  ? VITAMIN E PO Take by mouth.  ? ?No current facility-administered medications for this visit. (Other)  ? ? ? ? ?REVIEW OF SYSTEMS: ? ? ? ?ALLERGIES ?Allergies  ?Allergen Reactions  ? Rosanil Cleanser [Sulfacetamide  Sodium-Sulfur]   ?  Other reaction(s): (Sulfa Drugs) - Itching  ? Sulfa Antibiotics Itching  ? Sulfasalazine Itching  ? ? ?PAST MEDICAL HISTORY ?Past Medical History:  ?Diagnosis Date  ? Arthritis   ? large joint   ? Hyperlipidemia   ? Macular degeneration   ? Osteopenia   ? Osteoporosis   ? Pneumonia   ? childhood age 46   ? Varicose veins   ? ?Past Surgical History:  ?Procedure Laterality Date  ? CATARACT EXTRACTION Right   ? 2011  ? FRACTURE SURGERY  2007  ? BIL wrist  ? PARTIAL HYSTERECTOMY    ? TOTAL KNEE ARTHROPLASTY Right 01/10/2018  ? Procedure: RIGHT TOTAL KNEE ARTHROPLASTY;  Surgeon: Gaynelle Arabian, MD;  Location: [facility];  Service: Orthopedics;  Laterality: Right;  ? WRIST FRACTURE SURGERY Bilateral 2004  ? ? ?FAMILY HISTORY ?Family History  ?Problem Relation Age of Onset  ? Osteoarthritis Mother   ? Rheum arthritis Mother   ? Cancer Father   ?     liver  ? Heart disease Father   ? Hyperlipidemia Father   ? Hypertension Father   ? Heart attack Father   ? Other Father   ?     varicose veins  ? Hyperlipidemia Sister   ? Healthy Daughter   ? Healthy Son   ? ? ?SOCIAL HISTORY ?Social History  ? ?Tobacco  Use  ? Smoking status: Never  ? Smokeless tobacco: Never  ?Vaping Use  ? Vaping Use: Never used  ?Substance Use Topics  ? Alcohol use: Yes  ?  Comment: occ  ? Drug use: No  ? ?  ? ?  ? ?OPHTHALMIC EXAM: ? ?Base Eye Exam   ? ? Visual Acuity (ETDRS)   ? ?   Right Left  ? Dist [place] 20/160 -1 20/20 -2  ? Dist ph [place] 20/40 -2   ? ?  ?  ? ? Tonometry (Tonopen, 10:37 AM)   ? ?   Right Left  ? Pressure 17 16  ? ?  ?  ? ? Pupils   ? ?   Pupils APD  ? Right PERRL None  ? Left PERRL None  ? ?  ?  ? ? Visual Fields (Counting fingers)   ? ?   Left Right  ?  Full Full  ? ?  ?  ? ? Extraocular Movement   ? ?   Right Left  ?  Full Full  ? ?  ?  ? ? Neuro/Psych   ? ? Oriented x3: Yes  ? Mood/Affect: Normal  ? ?  ?  ? ? Dilation   ? ? Both eyes: 1.0% Mydriacyl, 2.5% Phenylephrine @ 10:37 AM  ? ?  ?  ? ?  ? ?Slit Lamp and Fundus Exam    ? ? External Exam   ? ?   Right Left  ? External Normal Normal  ? ?  ?  ? ? Slit Lamp Exam   ? ?   Right Left  ? Lids/Lashes Normal Normal  ? Conjunctiva/Sclera White and quiet White and quiet  ? Cornea Clear Clear  ? Anterior Chamber Deep and quiet Deep and quiet  ? Iris Round and reactive Round and reactive  ? Lens Posterior chamber intraocular lens Posterior chamber intraocular lens  ? Anterior Vitreous Normal Normal  ? ?  ?  ? ? Fundus Exam   ? ?   Right Left  ? Posterior Vitreous Normal Normal  ? Disc Normal Normal  ? C/D Ratio 0.45 0.45  ? Macula Intermediate age related macular degeneration, Soft drusen, no exudates, Retinal pigment epithelial mottling, no macular thickening, Pigmented atrophy with geographic atrophy centrally in the FAZ, no hemorrhage Intermediate age related macular degeneration, Soft drusen, no exudates, Retinal pigment epithelial mottling, no macular thickening, Pigmented atrophy, no hemorrhage  ? Vessels Normal Normal  ? Periphery Normal Normal  ? ?  ?  ? ?  ? ? ?IMAGING AND PROCEDURES  ?Imaging and Procedures for 06/24/21 ? ?OCT, Retina - OU - Both Eyes   ? ?   ?Right Eye ?Quality was good. Scan locations included subfoveal. Central Foveal Thickness: 270. Progression has been stable. Findings include retinal drusen , abnormal foveal contour, no SRF.  ? ?Left Eye ?Quality was good. Scan locations included subfoveal. Central Foveal Thickness: 274. Progression has improved. Findings include abnormal foveal contour, subretinal hyper-reflective material, subretinal fluid.  ? ?Notes ?OD, overall resolved subretinal fluid and from chronic serous detachment in the past.  Today at 7 months follow-up, with no progression of symptoms.  Outer retinal atrophy by OCT accounts for acuity.  Loss of ellipsoid layer.  No sign of CNVM OU ? ?OS with new subretinal fluid temporal to fovea ? ?  ? ?Color Fundus Photography Optos - OU - Both Eyes   ? ?   ?Right Eye ?Progression has been stable.  Disc findings  include normal observations. Macula : geographic atrophy, drusen. Vessels : normal observations. Periphery : normal observations.  ? ?Left Eye ?Progression has been stable. Disc findings include normal observations. Macula : drusen. Vessels : normal observations. Periphery : normal observations.  ? ?  ? ?Intravitreal Injection, Pharmacologic Agent - OS - Left Eye   ? ?   ?Time Out ?06/24/2021. 11:21 AM. Confirmed correct patient, procedure, site, and patient consented.  ? ?Anesthesia ?Topical anesthesia was used. Anesthetic medications included Lidocaine 4%.  ? ?Procedure ?Preparation included 5% betadine to ocular surface, 10% betadine to eyelids. A 30 gauge needle was used.  ? ?Injection: ?2.5 mg bevacizumab 2.5 MG/0.1ML ?  Route: Intravitreal, Site: Left Eye ?  [place]: [id number], Lot: [id number]  ? ?Post-op ?Post injection exam found visual acuity of at least counting fingers. The patient tolerated the procedure well. There were no complications. The patient received written and verbal post procedure care education. Post injection medications included ocuflox.  ? ?  ? ? ?  ?  ? ?  ?ASSESSMENT/PLAN: ? ?Advanced nonexudative age-related macular degeneration of right eye with subfoveal involvement ?Accounts for acuity OD ? ?Exudative age-related macular degeneration of right eye with inactive choroidal neovascularization ([place]) ?Now at 7 months post last injection OD, and no sign of recurrence OD ? ?Serous detachment of retinal pigment epithelium of right eye ?No sign of recurrence OD ? ?Exudative age-related macular degeneration of left eye with active choroidal neovascularization ([place]) ?OS, new signs of serous retinal detachment temporal FAZ, likely CNVM, will commence with therapy today commence with Avastin OS today  ? ?  ICD-10-CM   ?1. Exudative age-related macular degeneration of left eye with active choroidal neovascularization ([facility])  H35.3221 Intravitreal Injection, Pharmacologic Agent - OS - Left Eye  ?   bevacizumab (AVASTIN) SOSY 2.5 mg  ?  ?2. Exudative age-related macular degeneration of right eye with active choroidal neovascularization ([facility])  H35.3211 OCT, Retina - OU - Both Eyes  ?  Color Fundus Pho

## 2021-07-29 ENCOUNTER — Encounter (INDEPENDENT_AMBULATORY_CARE_PROVIDER_SITE_OTHER): Payer: Medicare Other | Admitting: Ophthalmology

## 2021-08-05 ENCOUNTER — Encounter (INDEPENDENT_AMBULATORY_CARE_PROVIDER_SITE_OTHER): Payer: Self-pay | Admitting: Ophthalmology

## 2021-08-05 ENCOUNTER — Ambulatory Visit (INDEPENDENT_AMBULATORY_CARE_PROVIDER_SITE_OTHER): Payer: Medicare Other | Admitting: Ophthalmology

## 2021-08-05 DIAGNOSIS — H353221 Exudative age-related macular degeneration, left eye, with active choroidal neovascularization: Secondary | ICD-10-CM

## 2021-08-05 DIAGNOSIS — H353212 Exudative age-related macular degeneration, right eye, with inactive choroidal neovascularization: Secondary | ICD-10-CM | POA: Diagnosis not present

## 2021-08-05 MED ORDER — BEVACIZUMAB 2.5 MG/0.1ML IZ SOSY
2.5000 mg | PREFILLED_SYRINGE | INTRAVITREAL | Status: AC | PRN
  Administered 2021-08-05: 2.5 mg via INTRAVITREAL

## 2021-08-05 NOTE — Progress Notes (Signed)
? ? ?08/05/2021 ? ?  ? ?CHIEF COMPLAINT ?Patient presents for  ?Chief Complaint  ?Patient presents with  ? Macular Degeneration  ? ? ? ? ?HISTORY OF PRESENT ILLNESS: ?Angela Collier is a 76 y.o. female who presents to the clinic today for:  ? ?HPI   ?5 weeks dilate OS, Avastin OS, OCT. ?Patient states vision is stable and unchanged since last visit. Denies any new floaters or FOL. ? ? ?Last edited by Laurin Coder on 08/05/2021 11:45 AM.  ?  ? ? ?Referring physician: ?Mayra Neer, MD ?[address] ?[address] ?[place],  [place] [address] ? ?HISTORICAL INFORMATION:  ? ?Selected notes from the [facility] ?  ?   ? ?CURRENT MEDICATIONS: ?Current Outpatient Medications (Ophthalmic Drugs)  ?Medication Sig  ? carboxymethylcellulose (REFRESH PLUS) 0.5 % SOLN Place 1 drop into both eyes 3 (three) times daily as needed (for dry eyes.).  ? ?No current facility-administered medications for this visit. (Ophthalmic Drugs)  ? ?Current Outpatient Medications (Other)  ?Medication Sig  ? CALCIUM PO Take by mouth.  ? Coenzyme Q10 (CO Q 10 PO) Take by mouth.  ? COLLAGEN PO Take by mouth. (Patient not taking: Reported on 05/28/2021)  ? lidocaine (XYLOCAINE) 2 % solution Lidocaine Viscous 2 % mucosal solution ? GARGLE 5 MLS EVERY 2 HOURS AS NEEDED  ? MAGNESIUM PO Take by mouth.  ? NON FORMULARY Take 1 Device by mouth at bedtime. EMA oral device for the treatment of macular degeneration  ? Omega-3 Fatty Acids (FISH OIL PO) Take by mouth daily.  ? Red Yeast Rice Extract (RED YEAST RICE PO) Take by mouth 2 (two) times daily.  ? Specialty Vitamins Products (COLLAGEN ULTRA) CAPS See admin instructions. (Patient not taking: Reported on 05/28/2021)  ? TURMERIC PO Take by mouth.  ? VITAMIN D PO Take by mouth 2 (two) times daily.  ? VITAMIN E PO Take by mouth.  ? ?No current facility-administered medications for this visit. (Other)  ? ? ? ? ?REVIEW OF SYSTEMS: ? ? ? ?ALLERGIES ?Allergies  ?Allergen Reactions  ? Rosanil Cleanser  [Sulfacetamide Sodium-Sulfur]   ?  Other reaction(s): (Sulfa Drugs) - Itching  ? Sulfa Antibiotics Itching  ? Sulfasalazine Itching  ? ? ?PAST MEDICAL HISTORY ?Past Medical History:  ?Diagnosis Date  ? Arthritis   ? large joint   ? Hyperlipidemia   ? Macular degeneration   ? Osteopenia   ? Osteoporosis   ? Pneumonia   ? childhood age 7   ? Varicose veins   ? ?Past Surgical History:  ?Procedure Laterality Date  ? CATARACT EXTRACTION Right   ? 2011  ? FRACTURE SURGERY  2007  ? BIL wrist  ? PARTIAL HYSTERECTOMY    ? TOTAL KNEE ARTHROPLASTY Right 01/10/2018  ? Procedure: RIGHT TOTAL KNEE ARTHROPLASTY;  Surgeon: Gaynelle Arabian, MD;  Location: [facility];  Service: Orthopedics;  Laterality: Right;  ? WRIST FRACTURE SURGERY Bilateral 2004  ? ? ?FAMILY HISTORY ?Family History  ?Problem Relation Age of Onset  ? Osteoarthritis Mother   ? Rheum arthritis Mother   ? Cancer Father   ?     liver  ? Heart disease Father   ? Hyperlipidemia Father   ? Hypertension Father   ? Heart attack Father   ? Other Father   ?     varicose veins  ? Hyperlipidemia Sister   ? Healthy Daughter   ? Healthy Son   ? ? ?SOCIAL HISTORY ?Social History  ? ?  Tobacco Use  ? Smoking status: Never  ? Smokeless tobacco: Never  ?Vaping Use  ? Vaping Use: Never used  ?Substance Use Topics  ? Alcohol use: Yes  ?  Comment: occ  ? Drug use: No  ? ?  ? ?  ? ?OPHTHALMIC EXAM: ? ?Base Eye Exam   ? ? Visual Acuity (ETDRS)   ? ?   Right Left  ? Dist [place] 20/200 20/25 -1  ? Dist ph [place] 20/30 -1   ?OD: monovision ? ?  ?  ? ? Tonometry (Tonopen, 11:39 AM)   ? ?   Right Left  ? Pressure 12 12  ? ?  ?  ? ? Pupils   ? ?   Pupils Dark Light APD  ? Right PERRL 4 3 None  ? Left PERRL 4 3 None  ? ?  ?  ? ? Extraocular Movement   ? ?   Right Left  ?  Full Full  ? ?  ?  ? ? Neuro/Psych   ? ? Oriented x3: Yes  ? Mood/Affect: Normal  ? ?  ?  ? ? Dilation   ? ? Left eye: 1.0% Mydriacyl, 2.5% Phenylephrine @ 11:37 AM  ? ?  ?  ? ?  ? ?Slit Lamp and Fundus Exam   ? ? External Exam   ? ?   Right  Left  ? External Normal Normal  ? ?  ?  ? ? Slit Lamp Exam   ? ?   Right Left  ? Lids/Lashes Normal Normal  ? Conjunctiva/Sclera White and quiet White and quiet  ? Cornea Clear Clear  ? Anterior Chamber Deep and quiet Deep and quiet  ? Iris Round and reactive Round and reactive  ? Lens Posterior chamber intraocular lens Posterior chamber intraocular lens  ? Anterior Vitreous Normal Normal  ? ?  ?  ? ? Fundus Exam   ? ?   Right Left  ? Posterior Vitreous  Normal  ? Disc  Normal  ? C/D Ratio  0.45  ? Macula  Intermediate age related macular degeneration, Soft drusen, no exudates, Retinal pigment epithelial mottling, no macular thickening, Pigmented atrophy, no hemorrhage  ? Vessels  Normal  ? Periphery  Normal  ? ?  ?  ? ?  ? ? ?IMAGING AND PROCEDURES  ?Imaging and Procedures for 08/05/21 ? ?OCT, Retina - OU - Both Eyes   ? ?   ?Right Eye ?Quality was good. Scan locations included subfoveal. Central Foveal Thickness: 263. Progression has been stable. Findings include retinal drusen , abnormal foveal contour, no SRF.  ? ?Left Eye ?Quality was good. Scan locations included subfoveal. Central Foveal Thickness: 273. Progression has improved. Findings include abnormal foveal contour, subretinal hyper-reflective material, subretinal fluid.  ? ?Notes ?OD, overall resolved subretinal fluid and from chronic serous detachment in the past.  Today at some 8 months, no progression of symptoms no sign of CNVM recurrence OD ? ?OS with new subretinal fluid temporal to fovea stable overall at 6 weeks post most recent injection Avastin.  Repeat today and if not improved next will change to [facility] ? ?  ? ? ?  ?  ? ?  ?ASSESSMENT/PLAN: ? ?Exudative age-related macular degeneration of right eye with inactive choroidal neovascularization ([place]) ?No sign of CNVM recurrence OD ? ?Exudative age-related macular degeneration of left eye with active choroidal neovascularization ([place]) ?Post Avastin No. 1 subretinal fluid remains stable, will repeat  injection today at 6-week interval  Avastin and follow-up next changed to [facility] if no improvement  ? ?  ICD-10-CM   ?1. Exudative age-related macular degeneration of left eye with active choroidal neovascularization (HCC)  H35.3221 Intravitreal Injection, Pharmacologic Agent - OS - Left Eye  ?  OCT, Retina - OU - Both Eyes  ?  ?2. Exudative age-related macular degeneration of right eye with inactive choroidal neovascularization ([place])  H35.3212   ?  ? ? ?1.  OS stable overall, new subretinal fluid temporal to FAZ post Avastin No. 1 yet not improved, will repeat again today and will change to [facility] next in 5 to 6 weeks if not improved. ? ?2.  OD still in active CNVM no sign of recurrence we will continue to monitor ? ?3. ? ?Ophthalmic Meds Ordered this visit:  ?No orders of the defined types were placed in this encounter. ? ? ?  ? ?Return in about 6 weeks (around 09/16/2021) for dilate, OS, EYLEA OCT,, this is a change. ? ?There are no Patient Instructions on file for this visit. ? ? ?Explained the diagnoses, plan, and follow up with the patient and they expressed understanding.  Patient expressed understanding of the importance of proper follow up care.  ? ?Clent Demark. Khai Arrona M.D. ?Diseases & Surgery of the Retina and Vitreous ?[place] ?08/05/21 ? ? ? ? ?Abbreviations: ?M myopia (nearsighted); A astigmatism; H hyperopia (farsighted); P presbyopia; Mrx spectacle prescription;  CTL contact lenses; OD right eye; OS left eye; OU both eyes  XT exotropia; ET esotropia; PEK punctate epithelial keratitis; PEE punctate epithelial erosions; DES dry eye syndrome; MGD meibomian gland dysfunction; ATs artificial tears; PFAT's preservative free artificial tears; [place] nuclear sclerotic cataract; PSC posterior subcapsular cataract; ERM epi-retinal membrane; PVD posterior vitreous detachment; RD retinal detachment; DM diabetes mellitus; DR diabetic retinopathy; NPDR non-proliferative diabetic retinopathy; PDR  proliferative diabetic retinopathy; CSME clinically significant macular edema; DME diabetic macular edema; dbh dot blot hemorrhages; CWS cotton wool spot; POAG primary open angle glaucoma; C/D cup-to-disc ratio; HV

## 2021-08-05 NOTE — Assessment & Plan Note (Signed)
Post Avastin No. 1 subretinal fluid remains stable, will repeat injection today at 6-week interval Avastin and follow-up next changed to [facility] if no improvement ?

## 2021-08-05 NOTE — Assessment & Plan Note (Signed)
No sign of CNVM recurrence OD ?

## 2021-08-06 DIAGNOSIS — M79641 Pain in right hand: Secondary | ICD-10-CM | POA: Diagnosis not present

## 2021-08-06 DIAGNOSIS — M13849 Other specified arthritis, unspecified hand: Secondary | ICD-10-CM | POA: Diagnosis not present

## 2021-08-06 DIAGNOSIS — M79642 Pain in left hand: Secondary | ICD-10-CM | POA: Diagnosis not present

## 2021-08-06 DIAGNOSIS — M13841 Other specified arthritis, right hand: Secondary | ICD-10-CM | POA: Diagnosis not present

## 2021-08-12 DIAGNOSIS — Z03818 Encounter for observation for suspected exposure to other biological agents ruled out: Secondary | ICD-10-CM | POA: Diagnosis not present

## 2021-08-12 DIAGNOSIS — R059 Cough, unspecified: Secondary | ICD-10-CM | POA: Diagnosis not present

## 2021-09-17 ENCOUNTER — Encounter (INDEPENDENT_AMBULATORY_CARE_PROVIDER_SITE_OTHER): Payer: Self-pay | Admitting: Ophthalmology

## 2021-09-17 ENCOUNTER — Ambulatory Visit (INDEPENDENT_AMBULATORY_CARE_PROVIDER_SITE_OTHER): Payer: Medicare Other | Admitting: Ophthalmology

## 2021-09-17 DIAGNOSIS — H353221 Exudative age-related macular degeneration, left eye, with active choroidal neovascularization: Secondary | ICD-10-CM

## 2021-09-17 DIAGNOSIS — H353212 Exudative age-related macular degeneration, right eye, with inactive choroidal neovascularization: Secondary | ICD-10-CM | POA: Diagnosis not present

## 2021-09-17 MED ORDER — AFLIBERCEPT 2MG/0.05ML IZ SOLN FOR KALEIDOSCOPE
2.0000 mg | INTRAVITREAL | Status: AC | PRN
  Administered 2021-09-17: 2 mg via INTRAVITREAL

## 2021-09-17 NOTE — Assessment & Plan Note (Signed)
No sign of CNVM OD 

## 2021-09-17 NOTE — Progress Notes (Signed)
09/17/2021     CHIEF COMPLAINT Patient presents for  Chief Complaint  Patient presents with   Macular Degeneration      HISTORY OF PRESENT ILLNESS: Angela Collier is a 76 y.o. female who presents to the clinic today for:   HPI   6 weeks for DILATE, OS, EYLEA, OCT. Pt stated vision has been stable since last visit. Pt denies floaters and FOL.  Last edited by Silvestre Moment on 09/17/2021 10:12 AM.      Referring physician: Monna Fam, MD [place],  [place] [phone]  HISTORICAL INFORMATION:   Selected notes from the MEDICAL RECORD NUMBER       CURRENT MEDICATIONS: Current Outpatient Medications (Ophthalmic Drugs)  Medication Sig   carboxymethylcellulose (REFRESH PLUS) 0.5 % SOLN Place 1 drop into both eyes 3 (three) times daily as needed (for dry eyes.).   No current facility-administered medications for this visit. (Ophthalmic Drugs)   Current Outpatient Medications (Other)  Medication Sig   CALCIUM PO Take by mouth.   Coenzyme Q10 (CO Q 10 PO) Take by mouth.   COLLAGEN PO Take by mouth. (Patient not taking: Reported on 05/28/2021)   lidocaine (XYLOCAINE) 2 % solution Lidocaine Viscous 2 % mucosal solution  GARGLE 5 MLS EVERY 2 HOURS AS NEEDED   MAGNESIUM PO Take by mouth.   NON FORMULARY Take 1 Device by mouth at bedtime. EMA oral device for the treatment of macular degeneration   Omega-3 Fatty Acids (FISH OIL PO) Take by mouth daily.   Red Yeast Rice Extract (RED YEAST RICE PO) Take by mouth 2 (two) times daily.   Specialty Vitamins Products (COLLAGEN ULTRA) CAPS See admin instructions. (Patient not taking: Reported on 05/28/2021)   TURMERIC PO Take by mouth.   VITAMIN D PO Take by mouth 2 (two) times daily.   VITAMIN E PO Take by mouth.   No current facility-administered medications for this visit. (Other)      REVIEW OF SYSTEMS: ROS   Negative for: Constitutional, Gastrointestinal, Neurological, Skin, Genitourinary, Musculoskeletal,  HENT, Endocrine, Cardiovascular, Eyes, Respiratory, Psychiatric, Allergic/Imm, Heme/Lymph Last edited by Silvestre Moment on 09/17/2021 10:12 AM.       ALLERGIES Allergies  Allergen Reactions   Rosanil Cleanser [Sulfacetamide Sodium-Sulfur]     Other reaction(s): (Sulfa Drugs) - Itching   Sulfa Antibiotics Itching   Sulfasalazine Itching    PAST MEDICAL HISTORY Past Medical History:  Diagnosis Date   Arthritis    large joint    Hyperlipidemia    Macular degeneration    Osteopenia    Osteoporosis    Pneumonia    childhood age 71    Varicose veins    Past Surgical History:  Procedure Laterality Date   CATARACT EXTRACTION Right    2011   FRACTURE SURGERY  2007   BIL wrist   PARTIAL HYSTERECTOMY     TOTAL KNEE ARTHROPLASTY Right 01/10/2018   Procedure: RIGHT TOTAL KNEE ARTHROPLASTY;  Surgeon: Gaynelle Arabian, MD;  Location: [facility];  Service: Orthopedics;  Laterality: Right;   WRIST FRACTURE SURGERY Bilateral 2004    FAMILY HISTORY Family History  Problem Relation Age of Onset   Osteoarthritis Mother    Rheum arthritis Mother    Cancer Father        liver   Heart disease Father    Hyperlipidemia Father    Hypertension Father    Heart attack Father    Other Father        varicose  veins   Hyperlipidemia Sister    Healthy Daughter    Healthy Son     SOCIAL HISTORY Social History   Tobacco Use   Smoking status: Never   Smokeless tobacco: Never  Vaping Use   Vaping Use: Never used  Substance Use Topics   Alcohol use: Yes    Comment: occ   Drug use: No         OPHTHALMIC EXAM:  Base Eye Exam     Visual Acuity (ETDRS)       Right Left   Dist [place] 20/150 20/20 -1   Dist ph  20/40          Tonometry (Tonopen, 10:19 AM)       Right Left   Pressure 15 13         Pupils       Pupils APD   Right PERRL None   Left PERRL None         Visual Fields       Left Right    Full Full         Extraocular Movement       Right Left    Full Full          Neuro/Psych     Oriented x3: Yes   Mood/Affect: Normal         Dilation     Left eye: 2.5% Phenylephrine, 1.0% Mydriacyl @ 10:18 AM           Slit Lamp and Fundus Exam     External Exam       Right Left   External Normal Normal         Slit Lamp Exam       Right Left   Lids/Lashes Normal Normal   Conjunctiva/Sclera White and quiet White and quiet   Cornea Clear Clear   Anterior Chamber Deep and quiet Deep and quiet   Iris Round and reactive Round and reactive   Lens Posterior chamber intraocular lens Posterior chamber intraocular lens   Anterior Vitreous Normal Normal         Fundus Exam       Right Left   Posterior Vitreous  Normal   Disc  Normal   C/D Ratio  0.45   Macula  Intermediate age related macular degeneration, Soft drusen, no exudates, Retinal pigment epithelial mottling, no macular thickening, Pigmented atrophy, no hemorrhage   Vessels  Normal   Periphery  Normal            IMAGING AND PROCEDURES  Imaging and Procedures for 09/17/21  OCT, Retina - OU - Both Eyes       Right Eye Quality was good. Scan locations included subfoveal. Central Foveal Thickness: 253. Progression has been stable. Findings include retinal drusen , abnormal foveal contour, no SRF.   Left Eye Quality was good. Scan locations included subfoveal. Central Foveal Thickness: 267. Progression has improved. Findings include abnormal foveal contour, subretinal hyper-reflective material, subretinal fluid.   Notes OD, overall resolved subretinal fluid and from chronic serous detachment in the past.  Today at some 9 months, no progression of symptoms no sign of CNVM recurrence OD  OS with no subretinal fluid temporal to fovea stable overall at 6 weeks post most recent injection Avastin.  Repeat today injection antivegF and then we will commence with intravitreal Eylea today and attempt to extend interval monitor next     Intravitreal Injection,  Pharmacologic Agent - OS - Left  Eye       Time Out 09/17/2021. 11:11 AM. Confirmed correct patient, procedure, site, and patient consented.   Anesthesia Topical anesthesia was used. Anesthetic medications included Lidocaine 4%.   Procedure Preparation included 5% betadine to ocular surface, 10% betadine to eyelids. A 30 gauge needle was used.   Injection: 2 mg aflibercept 2 MG/0.05ML   Route: Intravitreal, Site: Left Eye   NDC: [id number], Lot: [id number], Waste: 0 mL   Post-op Post injection exam found visual acuity of at least counting fingers. The patient tolerated the procedure well. There were no complications. The patient received written and verbal post procedure care education. Post injection medications included ocuflox.              ASSESSMENT/PLAN:  Exudative age-related macular degeneration of right eye with inactive choroidal neovascularization (HCC) No sign of CNVM OD  Exudative age-related macular degeneration of left eye with active choroidal neovascularization (HCC) OS, vastly improved with less subretinal fluid since 6 weeks after most recent Avastin.  We will change to [facility] OS today in order to allow for potential extension of interval examinations and control of macular disease of     ICD-10-CM   1. Exudative age-related macular degeneration of left eye with active choroidal neovascularization (HCC)  H35.3221 OCT, Retina - OU - Both Eyes    Intravitreal Injection, Pharmacologic Agent - OS - Left Eye    aflibercept (EYLEA) SOLN 2 mg    2. Exudative age-related macular degeneration of right eye with inactive choroidal neovascularization ([place])  H35.3212       1.  OS get 6 weeks post most recent injection, Avastin.  With previous recurrence of subretinal fluid on Avastin.  Now fluid has abated post injection.  We will repeat injection MAC-TEL vegF and switch to [facility] today for enhanced efficacy evaluation long-term  2.  3.  Ophthalmic Meds Ordered  this visit:  Meds ordered this encounter  Medications   aflibercept (EYLEA) SOLN 2 mg       Return in about 6 weeks (around 10/29/2021) for dilate, OS, EYLEA OCT.  There are no Patient Instructions on file for this visit.   Explained the diagnoses, plan, and follow up with the patient and they expressed understanding.  Patient expressed understanding of the importance of proper follow up care.   Clent Demark Remo Kirschenmann M.D. Diseases & Surgery of the Retina and Vitreous Retina & Diabetic [place] 09/17/21     Abbreviations: M myopia (nearsighted); A astigmatism; H hyperopia (farsighted); P presbyopia; Mrx spectacle prescription;  CTL contact lenses; OD right eye; OS left eye; OU both eyes  XT exotropia; ET esotropia; PEK punctate epithelial keratitis; PEE punctate epithelial erosions; DES dry eye syndrome; MGD meibomian gland dysfunction; ATs artificial tears; PFAT's preservative free artificial tears; [place] nuclear sclerotic cataract; PSC posterior subcapsular cataract; ERM epi-retinal membrane; PVD posterior vitreous detachment; RD retinal detachment; DM diabetes mellitus; DR diabetic retinopathy; NPDR non-proliferative diabetic retinopathy; PDR proliferative diabetic retinopathy; CSME clinically significant macular edema; DME diabetic macular edema; dbh dot blot hemorrhages; CWS cotton wool spot; POAG primary open angle glaucoma; C/D cup-to-disc ratio; HVF humphrey visual field; GVF goldmann visual field; OCT optical coherence tomography; IOP intraocular pressure; BRVO Branch retinal vein occlusion; CRVO central retinal vein occlusion; CRAO central retinal artery occlusion; BRAO branch retinal artery occlusion; RT retinal tear; SB scleral buckle; PPV pars plana vitrectomy; VH Vitreous hemorrhage; PRP panretinal laser photocoagulation; IVK intravitreal kenalog; VMT vitreomacular traction; MH Macular hole;  NVD neovascularization of the  disc; NVE neovascularization elsewhere; AREDS age related eye  disease study; ARMD age related macular degeneration; POAG primary open angle glaucoma; EBMD epithelial/anterior basement membrane dystrophy; ACIOL anterior chamber intraocular lens; IOL intraocular lens; PCIOL posterior chamber intraocular lens; Phaco/IOL phacoemulsification with intraocular lens placement; [place] photorefractive keratectomy; LASIK laser assisted in situ keratomileusis; HTN hypertension; DM diabetes mellitus; COPD chronic obstructive pulmonary disease

## 2021-09-17 NOTE — Assessment & Plan Note (Signed)
OS, vastly improved with less subretinal fluid since 6 weeks after most recent Avastin.  We will change to [facility] OS today in order to allow for potential extension of interval examinations and control of macular disease of

## 2021-09-23 DIAGNOSIS — L821 Other seborrheic keratosis: Secondary | ICD-10-CM | POA: Diagnosis not present

## 2021-09-23 DIAGNOSIS — D225 Melanocytic nevi of trunk: Secondary | ICD-10-CM | POA: Diagnosis not present

## 2021-09-23 DIAGNOSIS — L570 Actinic keratosis: Secondary | ICD-10-CM | POA: Diagnosis not present

## 2021-09-23 DIAGNOSIS — D1801 Hemangioma of skin and subcutaneous tissue: Secondary | ICD-10-CM | POA: Diagnosis not present

## 2021-10-29 ENCOUNTER — Encounter (INDEPENDENT_AMBULATORY_CARE_PROVIDER_SITE_OTHER): Payer: Self-pay | Admitting: Ophthalmology

## 2021-10-29 ENCOUNTER — Ambulatory Visit (INDEPENDENT_AMBULATORY_CARE_PROVIDER_SITE_OTHER): Payer: Medicare Other | Admitting: Ophthalmology

## 2021-10-29 DIAGNOSIS — H353114 Nonexudative age-related macular degeneration, right eye, advanced atrophic with subfoveal involvement: Secondary | ICD-10-CM | POA: Diagnosis not present

## 2021-10-29 DIAGNOSIS — H35721 Serous detachment of retinal pigment epithelium, right eye: Secondary | ICD-10-CM

## 2021-10-29 DIAGNOSIS — H353221 Exudative age-related macular degeneration, left eye, with active choroidal neovascularization: Secondary | ICD-10-CM | POA: Diagnosis not present

## 2021-10-29 MED ORDER — AFLIBERCEPT 2MG/0.05ML IZ SOLN FOR KALEIDOSCOPE
2.0000 mg | INTRAVITREAL | Status: AC | PRN
  Administered 2021-10-29: 2 mg via INTRAVITREAL

## 2021-10-29 NOTE — Progress Notes (Signed)
10/29/2021     CHIEF COMPLAINT Patient presents for  Chief Complaint  Patient presents with   Macular Degeneration      HISTORY OF PRESENT ILLNESS: Angela Collier is a 76 y.o. female who presents to the clinic today for:   HPI   6 weeks for DILATE, OS EYLEA, OCT. Pt stated vision has been stable since last visit however, pt stated it is a bit difficult to read smaller print.  Pt stated she was exposed to [place] but is not feeling symptomatic. Pt said she took a test this morning and it was negative.  Last edited by Silvestre Moment on 10/29/2021 10:41 AM.      Referring physician: Mayra Neer, MD [address] [place],  [place] [address]  HISTORICAL INFORMATION:   Selected notes from the MEDICAL RECORD NUMBER       CURRENT MEDICATIONS: Current Outpatient Medications (Ophthalmic Drugs)  Medication Sig   carboxymethylcellulose (REFRESH PLUS) 0.5 % SOLN Place 1 drop into both eyes 3 (three) times daily as needed (for dry eyes.).   No current facility-administered medications for this visit. (Ophthalmic Drugs)   Current Outpatient Medications (Other)  Medication Sig   CALCIUM PO Take by mouth.   Coenzyme Q10 (CO Q 10 PO) Take by mouth.   COLLAGEN PO Take by mouth. (Patient not taking: Reported on 05/28/2021)   lidocaine (XYLOCAINE) 2 % solution Lidocaine Viscous 2 % mucosal solution  GARGLE 5 MLS EVERY 2 HOURS AS NEEDED   MAGNESIUM PO Take by mouth.   NON FORMULARY Take 1 Device by mouth at bedtime. EMA oral device for the treatment of macular degeneration   Omega-3 Fatty Acids (FISH OIL PO) Take by mouth daily.   Red Yeast Rice Extract (RED YEAST RICE PO) Take by mouth 2 (two) times daily.   Specialty Vitamins Products (COLLAGEN ULTRA) CAPS See admin instructions. (Patient not taking: Reported on 05/28/2021)   TURMERIC PO Take by mouth.   VITAMIN D PO Take by mouth 2 (two) times daily.   VITAMIN E PO Take by mouth.   No current facility-administered  medications for this visit. (Other)      REVIEW OF SYSTEMS: ROS   Negative for: Constitutional, Gastrointestinal, Neurological, Skin, Genitourinary, Musculoskeletal, HENT, Endocrine, Cardiovascular, Eyes, Respiratory, Psychiatric, Allergic/Imm, Heme/Lymph Last edited by Silvestre Moment on 10/29/2021 10:41 AM.       ALLERGIES Allergies  Allergen Reactions   Rosanil Cleanser [Sulfacetamide Sodium-Sulfur]     Other reaction(s): (Sulfa Drugs) - Itching   Sulfa Antibiotics Itching   Sulfasalazine Itching    PAST MEDICAL HISTORY Past Medical History:  Diagnosis Date   Arthritis    large joint    Hyperlipidemia    Macular degeneration    Osteopenia    Osteoporosis    Pneumonia    childhood age 73    Varicose veins    Past Surgical History:  Procedure Laterality Date   CATARACT EXTRACTION Right    2011   FRACTURE SURGERY  2007   BIL wrist   PARTIAL HYSTERECTOMY     TOTAL KNEE ARTHROPLASTY Right 01/10/2018   Procedure: RIGHT TOTAL KNEE ARTHROPLASTY;  Surgeon: Gaynelle Arabian, MD;  Location: [facility];  Service: Orthopedics;  Laterality: Right;   WRIST FRACTURE SURGERY Bilateral 2004    FAMILY HISTORY Family History  Problem Relation Age of Onset   Osteoarthritis Mother    Rheum arthritis Mother    Cancer Father        liver  Heart disease Father    Hyperlipidemia Father    Hypertension Father    Heart attack Father    Other Father        varicose veins   Hyperlipidemia Sister    Healthy Daughter    Healthy Son     SOCIAL HISTORY Social History   Tobacco Use   Smoking status: Never   Smokeless tobacco: Never  Vaping Use   Vaping Use: Never used  Substance Use Topics   Alcohol use: Yes    Comment: occ   Drug use: No         OPHTHALMIC EXAM:  Base Eye Exam     Visual Acuity (ETDRS)       Right Left   Dist [place] 20/150 20/20 -2   Dist ph [place] 20/40 -2          Tonometry (Tonopen, 10:48 AM)       Right Left   Pressure 18 19         Pupils        Pupils APD   Right PERRL None   Left PERRL None         Visual Fields       Left Right    Full Full         Extraocular Movement       Right Left    Full Full         Neuro/Psych     Oriented x3: Yes   Mood/Affect: Normal         Dilation     Left eye: 2.5% Phenylephrine, 1.0% Mydriacyl @ 10:48 AM           Slit Lamp and Fundus Exam     External Exam       Right Left   External Normal Normal         Slit Lamp Exam       Right Left   Lids/Lashes Normal Normal   Conjunctiva/Sclera White and quiet White and quiet   Cornea Clear Clear   Anterior Chamber Deep and quiet Deep and quiet   Iris Round and reactive Round and reactive   Lens Posterior chamber intraocular lens Posterior chamber intraocular lens   Anterior Vitreous Normal Normal         Fundus Exam       Right Left   Posterior Vitreous  Normal   Disc  Normal   C/D Ratio  0.45   Macula  Intermediate age related macular degeneration, Soft drusen, no exudates, Retinal pigment epithelial mottling, no macular thickening, Pigmented atrophy, no hemorrhage   Vessels  Normal   Periphery  Normal            IMAGING AND PROCEDURES  Imaging and Procedures for 10/29/21  OCT, Retina - OU - Both Eyes       Right Eye Quality was good. Scan locations included subfoveal. Progression has been stable. Findings include no SRF, abnormal foveal contour, retinal drusen .   Left Eye Quality was good. Scan locations included subfoveal. Central Foveal Thickness: 267. Progression has improved. Findings include abnormal foveal contour, subretinal hyper-reflective material, subretinal fluid.   Notes OD, overall resolved subretinal fluid and from chronic serous detachment in the past.  Today at some 9 months, no progression of symptoms no sign of CNVM recurrence OD  OS with no subretinal fluid temporal to fovea stable overall at 6 weeks post most recent injection Eylea injection #1.  Repeat today  injection antivegF and then we will commence with intravitreal Eylea today and attempt to extend interval monitor next      Intravitreal Injection, Pharmacologic Agent - OS - Left Eye       Time Out 10/29/2021. 11:10 AM. Confirmed correct patient, procedure, site, and patient consented.   Anesthesia Topical anesthesia was used. Anesthetic medications included Lidocaine 4%.   Procedure Preparation included 5% betadine to ocular surface, 10% betadine to eyelids. A 30 gauge needle was used.   Injection: 2 mg aflibercept 2 MG/0.05ML   Route: Intravitreal, Site: Left Eye   NDC: [id number], Lot: [id number], Expiration date: 04/13/2022, Waste: 0 mL   Post-op Post injection exam found visual acuity of at least counting fingers. The patient tolerated the procedure well. There were no complications. The patient received written and verbal post procedure care education. Post injection medications included ocuflox.              ASSESSMENT/PLAN:  Exudative age-related macular degeneration of left eye with active choroidal neovascularization (HCC) Much improved OS post change to [facility].  Eylea injection today  Serous detachment of retinal pigment epithelium of right eye Not active OD at this time  Advanced nonexudative age-related macular degeneration of right eye with subfoveal involvement Minor impact on acuity     ICD-10-CM   1. Exudative age-related macular degeneration of left eye with active choroidal neovascularization (HCC)  H35.3221 OCT, Retina - OU - Both Eyes    Intravitreal Injection, Pharmacologic Agent - OS - Left Eye    aflibercept (EYLEA) SOLN 2 mg    2. Serous detachment of retinal pigment epithelium of right eye  H35.721     3. Advanced nonexudative age-related macular degeneration of right eye with subfoveal involvement  H35.3114       1.  OS doing well post injection #1, Eylea.  2.  Repeat intravitreal Eylea OS today reevaluate next in 6  weeks  3.  Ophthalmic Meds Ordered this visit:  Meds ordered this encounter  Medications   aflibercept (EYLEA) SOLN 2 mg       Return in about 6 weeks (around 12/10/2021) for dilate, OS, EYLEA OCT.  There are no Patient Instructions on file for this visit.   Explained the diagnoses, plan, and follow up with the patient and they expressed understanding.  Patient expressed understanding of the importance of proper follow up care.   Clent Demark Axzel Rockhill M.D. Diseases & Surgery of the Retina and Vitreous [facility] [place] 10/29/21     Abbreviations: M myopia (nearsighted); A astigmatism; H hyperopia (farsighted); P presbyopia; Mrx spectacle prescription;  CTL contact lenses; OD right eye; OS left eye; OU both eyes  XT exotropia; ET esotropia; PEK punctate epithelial keratitis; PEE punctate epithelial erosions; DES dry eye syndrome; MGD meibomian gland dysfunction; ATs artificial tears; PFAT's preservative free artificial tears; [place] nuclear sclerotic cataract; PSC posterior subcapsular cataract; ERM epi-retinal membrane; PVD posterior vitreous detachment; RD retinal detachment; DM diabetes mellitus; DR diabetic retinopathy; NPDR non-proliferative diabetic retinopathy; PDR proliferative diabetic retinopathy; CSME clinically significant macular edema; DME diabetic macular edema; dbh dot blot hemorrhages; CWS cotton wool spot; POAG primary open angle glaucoma; C/D cup-to-disc ratio; HVF humphrey visual field; GVF goldmann visual field; OCT optical coherence tomography; IOP intraocular pressure; BRVO Branch retinal vein occlusion; CRVO central retinal vein occlusion; CRAO central retinal artery occlusion; BRAO branch retinal artery occlusion; RT retinal tear; SB scleral buckle; PPV pars plana vitrectomy; VH Vitreous hemorrhage; PRP panretinal laser photocoagulation; IVK intravitreal  kenalog; VMT vitreomacular traction; MH Macular hole;  NVD neovascularization of the disc; NVE  neovascularization elsewhere; AREDS age related eye disease study; ARMD age related macular degeneration; POAG primary open angle glaucoma; EBMD epithelial/anterior basement membrane dystrophy; ACIOL anterior chamber intraocular lens; IOL intraocular lens; PCIOL posterior chamber intraocular lens; Phaco/IOL phacoemulsification with intraocular lens placement; [place] photorefractive keratectomy; LASIK laser assisted in situ keratomileusis; HTN hypertension; DM diabetes mellitus; COPD chronic obstructive pulmonary disease

## 2021-10-29 NOTE — Assessment & Plan Note (Signed)
Minor impact on acuity

## 2021-10-29 NOTE — Assessment & Plan Note (Signed)
Not active OD at this time

## 2021-10-29 NOTE — Assessment & Plan Note (Signed)
Much improved OS post change to [facility].  Eylea injection today

## 2021-11-11 ENCOUNTER — Telehealth: Payer: Self-pay

## 2021-11-11 NOTE — Patient Outreach (Signed)
  Care Management   Outreach Note  11/11/2021 Name: Angela Collier MRN: [id number] DOB: 1946-03-03  An unsuccessful telephone outreach was attempted today. The patient was referred to the case management team for assistance with care management and care coordination.   Follow Up Plan:  A HIPAA compliant voice message was left today requesting a return call.   [place] Management [phone]

## 2021-12-02 ENCOUNTER — Ambulatory Visit: Payer: Self-pay

## 2021-12-02 NOTE — Patient Instructions (Signed)
Visit Information  Thank you for allowing the Care Management team to participate in your care. It was great speaking with you today! I will contact you after I speak with the team at [place] regarding your Annual Wellness Visit.   Following are the goals we discussed today:   Goals Addressed             This Visit's Progress    Health Maintenance       Care Coordination Interventions: Reviewed plan for disease management. Reports doing well. Currently not requiring treatment for chronic illnesses. Reviewed medication profile. Patient reports not requiring prescription medications. Currently using OTC eyedrops and supplements as needed. Last AWV completed on 01/07/21. Does not recall receiving a notification for an upcoming AWV appointment. Will follow up with [facility] Medicine to assist with scheduling. Overall doing very well. Will contact for care coordination as needed.          Angela Collier verbalized understanding of information discussed today. Declined need for mailed instructions or resources.   Will follow up for care coordination as needed.   [place] Management [phone]

## 2021-12-02 NOTE — Patient Outreach (Signed)
  Care Coordination   Initial Visit Note   12/02/2021 Name: Yenny Kosa MRN: [id number] DOB: November 04, 1945  Joel Mericle is a 76 y.o. year old female who sees Mayra Neer, MD for primary care. I spoke with  Kathlee Nations by phone today  What matters to the patients health and wellness today?  Health Maintenance    Goals Addressed             This Visit's Progress    Health Maintenance       Care Coordination Interventions: Reviewed plan for disease management. Reports doing well. Currently not requiring treatment for chronic illnesses. Reviewed medication profile. Patient reports not requiring prescription medications. Currently using OTC eyedrops and supplements as needed. Last AWV completed on 01/07/21. Does not recall receiving a notification for an upcoming AWV appointment. Will follow up with [facility] Medicine to assist with scheduling. Overall doing very well. Will contact for care coordination as needed.        SDOH assessments and interventions completed:  No   Care Coordination Interventions Activated:  Yes  Care Coordination Interventions:  Yes, provided   Follow up plan:  Will follow with [facility] regarding Annual Wellness.    Encounter Outcome:  Pt. Visit Completed   [place] Management [phone]

## 2021-12-11 ENCOUNTER — Encounter (INDEPENDENT_AMBULATORY_CARE_PROVIDER_SITE_OTHER): Payer: Self-pay | Admitting: Ophthalmology

## 2021-12-11 ENCOUNTER — Ambulatory Visit (INDEPENDENT_AMBULATORY_CARE_PROVIDER_SITE_OTHER): Payer: Medicare Other | Admitting: Ophthalmology

## 2021-12-11 DIAGNOSIS — H3322 Serous retinal detachment, left eye: Secondary | ICD-10-CM | POA: Diagnosis not present

## 2021-12-11 DIAGNOSIS — H353221 Exudative age-related macular degeneration, left eye, with active choroidal neovascularization: Secondary | ICD-10-CM | POA: Diagnosis not present

## 2021-12-11 DIAGNOSIS — H35721 Serous detachment of retinal pigment epithelium, right eye: Secondary | ICD-10-CM | POA: Diagnosis not present

## 2021-12-11 DIAGNOSIS — H353212 Exudative age-related macular degeneration, right eye, with inactive choroidal neovascularization: Secondary | ICD-10-CM

## 2021-12-11 MED ORDER — AFLIBERCEPT 2MG/0.05ML IZ SOLN FOR KALEIDOSCOPE
2.0000 mg | INTRAVITREAL | Status: AC | PRN
  Administered 2021-12-11: 2 mg via INTRAVITREAL

## 2021-12-11 NOTE — Assessment & Plan Note (Signed)
Improved as component of wet AMD OS

## 2021-12-11 NOTE — Assessment & Plan Note (Signed)
OS for injection and Leah #3 now much improved currently at 6-week interval.  Repeat injection today and extend interval next to 8 to 9 weeks

## 2021-12-11 NOTE — Assessment & Plan Note (Signed)
No sign of recurrence is OD

## 2021-12-11 NOTE — Assessment & Plan Note (Signed)
No sign of recurrence OD ?

## 2021-12-11 NOTE — Progress Notes (Signed)
12/11/2021     CHIEF COMPLAINT Patient presents for  Chief Complaint  Patient presents with   Macular Degeneration      HISTORY OF PRESENT ILLNESS: Angela Collier is a 76 y.o. female who presents to the clinic today for:   HPI   6 weeks dilate os eylea oct Pt states her vision has been stable Pt denies any new floaters or FOL  Last edited by Morene Rankins, CMA on 12/11/2021 10:23 AM.      Referring physician: Mayra Neer, MD [address] [place],  [place] [address]  HISTORICAL INFORMATION:   Selected notes from the MEDICAL RECORD NUMBER       CURRENT MEDICATIONS: Current Outpatient Medications (Ophthalmic Drugs)  Medication Sig   carboxymethylcellulose (REFRESH PLUS) 0.5 % SOLN Place 1 drop into both eyes 3 (three) times daily as needed (for dry eyes.).   No current facility-administered medications for this visit. (Ophthalmic Drugs)   Current Outpatient Medications (Other)  Medication Sig   CALCIUM PO Take by mouth.   Coenzyme Q10 (CO Q 10 PO) Take by mouth.   COLLAGEN PO Take by mouth. (Patient not taking: Reported on 05/28/2021)   lidocaine (XYLOCAINE) 2 % solution Lidocaine Viscous 2 % mucosal solution  GARGLE 5 MLS EVERY 2 HOURS AS NEEDED   MAGNESIUM PO Take by mouth.   NON FORMULARY Take 1 Device by mouth at bedtime. EMA oral device for the treatment of macular degeneration   Omega-3 Fatty Acids (FISH OIL PO) Take by mouth daily.   Red Yeast Rice Extract (RED YEAST RICE PO) Take by mouth 2 (two) times daily.   Specialty Vitamins Products (COLLAGEN ULTRA) CAPS See admin instructions. (Patient not taking: Reported on 05/28/2021)   TURMERIC PO Take by mouth.   VITAMIN D PO Take by mouth 2 (two) times daily.   VITAMIN E PO Take by mouth.   No current facility-administered medications for this visit. (Other)      REVIEW OF SYSTEMS: ROS   Negative for: Constitutional, Gastrointestinal, Neurological, Skin, Genitourinary,  Musculoskeletal, HENT, Endocrine, Cardiovascular, Eyes, Respiratory, Psychiatric, Allergic/Imm, Heme/Lymph Last edited by Orene Desanctis D, CMA on 12/11/2021 10:23 AM.       ALLERGIES Allergies  Allergen Reactions   Rosanil Cleanser [Sulfacetamide Sodium-Sulfur]     Other reaction(s): (Sulfa Drugs) - Itching   Sulfa Antibiotics Itching   Sulfasalazine Itching    PAST MEDICAL HISTORY Past Medical History:  Diagnosis Date   Arthritis    large joint    Hyperlipidemia    Macular degeneration    Osteopenia    Osteoporosis    Pneumonia    childhood age 49    Varicose veins    Past Surgical History:  Procedure Laterality Date   CATARACT EXTRACTION Right    2011   FRACTURE SURGERY  2007   BIL wrist   PARTIAL HYSTERECTOMY     TOTAL KNEE ARTHROPLASTY Right 01/10/2018   Procedure: RIGHT TOTAL KNEE ARTHROPLASTY;  Surgeon: Gaynelle Arabian, MD;  Location: [facility];  Service: Orthopedics;  Laterality: Right;   WRIST FRACTURE SURGERY Bilateral 2004    FAMILY HISTORY Family History  Problem Relation Age of Onset   Osteoarthritis Mother    Rheum arthritis Mother    Cancer Father        liver   Heart disease Father    Hyperlipidemia Father    Hypertension Father    Heart attack Father    Other Father  varicose veins   Hyperlipidemia Sister    Healthy Daughter    Healthy Son     SOCIAL HISTORY Social History   Tobacco Use   Smoking status: Never   Smokeless tobacco: Never  Vaping Use   Vaping Use: Never used  Substance Use Topics   Alcohol use: Yes    Comment: occ   Drug use: No         OPHTHALMIC EXAM:  Base Eye Exam     Visual Acuity (ETDRS)       Right Left   Dist [place]  20/25 +2   Dist ph [place] 20/25          Tonometry (Tonopen, 10:30 AM)       Right Left   Pressure 11 13         Extraocular Movement       Right Left    Ortho Ortho    -- -- --  --  --  -- -- --   -- -- --  --  --  -- -- --           Neuro/Psych     Oriented  x3: Yes   Mood/Affect: Normal         Dilation     Left eye: 2.5% Phenylephrine, 1.0% Mydriacyl @ 10:25 AM           Slit Lamp and Fundus Exam     External Exam       Right Left   External Normal Normal         Slit Lamp Exam       Right Left   Lids/Lashes Normal Normal   Conjunctiva/Sclera White and quiet White and quiet   Cornea Clear Clear   Anterior Chamber Deep and quiet Deep and quiet   Iris Round and reactive Round and reactive   Lens Posterior chamber intraocular lens Posterior chamber intraocular lens   Anterior Vitreous Normal Normal         Fundus Exam       Right Left   Posterior Vitreous  Normal   Disc  Normal   C/D Ratio  0.45   Macula  Intermediate age related macular degeneration, Soft drusen, no exudates, Retinal pigment epithelial mottling, no macular thickening, Pigmented atrophy, no hemorrhage   Vessels  Normal   Periphery  Normal            IMAGING AND PROCEDURES  Imaging and Procedures for 12/11/21  OCT, Retina - OU - Both Eyes       Right Eye Quality was good. Scan locations included subfoveal. Central Foveal Thickness: 258. Progression has been stable. Findings include no SRF, abnormal foveal contour, retinal drusen .   Left Eye Quality was good. Scan locations included subfoveal. Central Foveal Thickness: 265. Progression has improved. Findings include abnormal foveal contour, subretinal hyper-reflective material, subretinal fluid.   Notes OD, overall resolved subretinal fluid and from chronic serous detachment in the past.  Today at some 9 months, no progression of symptoms no sign of CNVM recurrence OD  OS with no subretinal fluid temporal to fovea stable overall at 6 weeks post most recent injection Eylea injections.  Repeat today injection antivegF and then we will commence with intravitreal Eylea today and attempt to extend interval monitor next     Intravitreal Injection, Pharmacologic Agent - OS - Left Eye        Time Out 12/11/2021. 11:28 AM. Confirmed correct patient, procedure, site, and patient  consented.   Anesthesia Topical anesthesia was used. Anesthetic medications included Lidocaine 4%.   Procedure Preparation included 5% betadine to ocular surface, 10% betadine to eyelids. A 30 gauge needle was used.   Injection: 2 mg aflibercept 2 MG/0.05ML   Route: Intravitreal, Site: Left Eye   NDC: [id number], Lot: [id number], Expiration date: 07/13/2022, Waste: 0 mL   Post-op Post injection exam found visual acuity of at least counting fingers. The patient tolerated the procedure well. There were no complications. The patient received written and verbal post procedure care education. Post injection medications included ocuflox.              ASSESSMENT/PLAN:  Exudative age-related macular degeneration of left eye with active choroidal neovascularization (HCC) OS for injection and Leah #3 now much improved currently at 6-week interval.  Repeat injection today and extend interval next to 8 to 9 weeks  Serous detachment of retinal pigment epithelium of right eye No sign of recurrence is OD  Serous retinal detachment, left eye Improved as component of wet AMD OS  Exudative age-related macular degeneration of right eye with inactive choroidal neovascularization (HCC) No sign of recurrence OD     ICD-10-CM   1. Exudative age-related macular degeneration of left eye with active choroidal neovascularization (HCC)  H35.3221 OCT, Retina - OU - Both Eyes    Intravitreal Injection, Pharmacologic Agent - OS - Left Eye    aflibercept (EYLEA) SOLN 2 mg    2. Serous detachment of retinal pigment epithelium of right eye  H35.721     3. Serous retinal detachment, left eye  H33.22     4. Exudative age-related macular degeneration of right eye with inactive choroidal neovascularization ([place])  H35.3212       1.  OS improving overall.  Less serous retinal detachment associated with wet AMD.   Repeat injection today at 6-week interval for #3 Eylea.  Therefore extend interval examination next 8 to 9 weeks  2.  3.  Ophthalmic Meds Ordered this visit:  Meds ordered this encounter  Medications   aflibercept (EYLEA) SOLN 2 mg       Return in about 8 weeks (around 02/05/2022) for dilate, OS, EYLEA OCT.  There are no Patient Instructions on file for this visit.   Explained the diagnoses, plan, and follow up with the patient and they expressed understanding.  Patient expressed understanding of the importance of proper follow up care.   Clent Demark Alonte Wulff M.D. Diseases & Surgery of the Retina and Vitreous Retina & Diabetic [place] 12/11/21     Abbreviations: M myopia (nearsighted); A astigmatism; H hyperopia (farsighted); P presbyopia; Mrx spectacle prescription;  CTL contact lenses; OD right eye; OS left eye; OU both eyes  XT exotropia; ET esotropia; PEK punctate epithelial keratitis; PEE punctate epithelial erosions; DES dry eye syndrome; MGD meibomian gland dysfunction; ATs artificial tears; PFAT's preservative free artificial tears; [place] nuclear sclerotic cataract; PSC posterior subcapsular cataract; ERM epi-retinal membrane; PVD posterior vitreous detachment; RD retinal detachment; DM diabetes mellitus; DR diabetic retinopathy; NPDR non-proliferative diabetic retinopathy; PDR proliferative diabetic retinopathy; CSME clinically significant macular edema; DME diabetic macular edema; dbh dot blot hemorrhages; CWS cotton wool spot; POAG primary open angle glaucoma; C/D cup-to-disc ratio; HVF humphrey visual field; GVF goldmann visual field; OCT optical coherence tomography; IOP intraocular pressure; BRVO Branch retinal vein occlusion; CRVO central retinal vein occlusion; CRAO central retinal artery occlusion; BRAO branch retinal artery occlusion; RT retinal tear; SB scleral buckle; PPV pars plana vitrectomy; VH Vitreous  hemorrhage; PRP panretinal laser photocoagulation; IVK  intravitreal kenalog; VMT vitreomacular traction; MH Macular hole;  NVD neovascularization of the disc; NVE neovascularization elsewhere; AREDS age related eye disease study; ARMD age related macular degeneration; POAG primary open angle glaucoma; EBMD epithelial/anterior basement membrane dystrophy; ACIOL anterior chamber intraocular lens; IOL intraocular lens; PCIOL posterior chamber intraocular lens; Phaco/IOL phacoemulsification with intraocular lens placement; [place] photorefractive keratectomy; LASIK laser assisted in situ keratomileusis; HTN hypertension; DM diabetes mellitus; COPD chronic obstructive pulmonary disease

## 2022-01-14 DIAGNOSIS — R7301 Impaired fasting glucose: Secondary | ICD-10-CM | POA: Diagnosis not present

## 2022-01-14 DIAGNOSIS — E7800 Pure hypercholesterolemia, unspecified: Secondary | ICD-10-CM | POA: Diagnosis not present

## 2022-01-27 DIAGNOSIS — Z1231 Encounter for screening mammogram for malignant neoplasm of breast: Secondary | ICD-10-CM | POA: Diagnosis not present

## 2022-02-09 ENCOUNTER — Encounter (INDEPENDENT_AMBULATORY_CARE_PROVIDER_SITE_OTHER): Payer: Medicare Other | Admitting: Ophthalmology

## 2022-02-10 DIAGNOSIS — Z0000 Encounter for general adult medical examination without abnormal findings: Secondary | ICD-10-CM | POA: Diagnosis not present

## 2022-02-10 DIAGNOSIS — M810 Age-related osteoporosis without current pathological fracture: Secondary | ICD-10-CM | POA: Diagnosis not present

## 2022-02-10 DIAGNOSIS — M1990 Unspecified osteoarthritis, unspecified site: Secondary | ICD-10-CM | POA: Diagnosis not present

## 2022-02-10 DIAGNOSIS — R7301 Impaired fasting glucose: Secondary | ICD-10-CM | POA: Diagnosis not present

## 2022-02-10 DIAGNOSIS — E7800 Pure hypercholesterolemia, unspecified: Secondary | ICD-10-CM | POA: Diagnosis not present

## 2022-02-11 ENCOUNTER — Encounter (INDEPENDENT_AMBULATORY_CARE_PROVIDER_SITE_OTHER): Payer: Medicare Other | Admitting: Ophthalmology

## 2022-02-11 DIAGNOSIS — H353221 Exudative age-related macular degeneration, left eye, with active choroidal neovascularization: Secondary | ICD-10-CM | POA: Diagnosis not present

## 2022-02-11 DIAGNOSIS — H35371 Puckering of macula, right eye: Secondary | ICD-10-CM | POA: Diagnosis not present

## 2022-02-11 DIAGNOSIS — H43813 Vitreous degeneration, bilateral: Secondary | ICD-10-CM | POA: Diagnosis not present

## 2022-02-11 DIAGNOSIS — H353212 Exudative age-related macular degeneration, right eye, with inactive choroidal neovascularization: Secondary | ICD-10-CM | POA: Diagnosis not present

## 2022-04-17 DIAGNOSIS — H353212 Exudative age-related macular degeneration, right eye, with inactive choroidal neovascularization: Secondary | ICD-10-CM | POA: Diagnosis not present

## 2022-04-17 DIAGNOSIS — H35371 Puckering of macula, right eye: Secondary | ICD-10-CM | POA: Diagnosis not present

## 2022-04-17 DIAGNOSIS — H43813 Vitreous degeneration, bilateral: Secondary | ICD-10-CM | POA: Diagnosis not present

## 2022-04-17 DIAGNOSIS — H353221 Exudative age-related macular degeneration, left eye, with active choroidal neovascularization: Secondary | ICD-10-CM | POA: Diagnosis not present

## 2022-04-20 DIAGNOSIS — J069 Acute upper respiratory infection, unspecified: Secondary | ICD-10-CM | POA: Diagnosis not present

## 2022-05-14 NOTE — Progress Notes (Deleted)
Office Visit Note  Patient: Angela Collier             Date of Birth: 1945/05/30           MRN: [id number]             PCP: Mayra Neer, MD Referring: Mayra Neer, MD Visit Date: 05/28/2022 Occupation: '@GUAROCC'$ @  Subjective:  No chief complaint on file.   History of Present Illness: Angela Collier is a 77 y.o. female ***     Activities of Daily Living:  Patient reports morning stiffness for *** {minute/hour:19697}.   Patient {ACTIONS;DENIES/REPORTS:21021675::"Denies"} nocturnal pain.  Difficulty dressing/grooming: {ACTIONS;DENIES/REPORTS:21021675::"Denies"} Difficulty climbing stairs: {ACTIONS;DENIES/REPORTS:21021675::"Denies"} Difficulty getting out of chair: {ACTIONS;DENIES/REPORTS:21021675::"Denies"} Difficulty using hands for taps, buttons, cutlery, and/or writing: {ACTIONS;DENIES/REPORTS:21021675::"Denies"}  No Rheumatology ROS completed.   PMFS History:  Patient Active Problem List   Diagnosis Date Noted   Serous retinal detachment, left eye 06/24/2021   Exudative age-related macular degeneration of left eye with active choroidal neovascularization ([place]) 06/24/2021   Intermediate stage nonexudative age-related macular degeneration of left eye 02/08/2020   Exudative age-related macular degeneration of right eye with inactive choroidal neovascularization ([place]) 08/10/2019   Advanced nonexudative age-related macular degeneration of right eye with subfoveal involvement 08/10/2019   Serous detachment of retinal pigment epithelium of right eye 08/10/2019   Degenerative retinal drusen of both eyes 08/10/2019   Pseudophakia 08/10/2019   Osteoarthritis of right knee 01/12/2018   OA (osteoarthritis) of knee 01/10/2018   Age-related osteoporosis without current pathological fracture 08/12/2016   Osteoarthritis of both knees 02/24/2016   Knee effusion, right 02/24/2016   Patellar luxation 02/24/2016   Osteoarthritis of both feet 02/24/2016   Osteoarthritis of both  hands 02/24/2016   Macular degeneration 02/24/2016   Varicose veins of lower extremities with other complications 61/60/7371    Past Medical History:  Diagnosis Date   Arthritis    large joint    Hyperlipidemia    Macular degeneration    Osteopenia    Osteoporosis    Pneumonia    childhood age 45    Varicose veins     Family History  Problem Relation Age of Onset   Osteoarthritis Mother    Rheum arthritis Mother    Cancer Father        liver   Heart disease Father    Hyperlipidemia Father    Hypertension Father    Heart attack Father    Other Father        varicose veins   Hyperlipidemia Sister    Healthy Daughter    Healthy Son    Past Surgical History:  Procedure Laterality Date   CATARACT EXTRACTION Right    2011   FRACTURE SURGERY  2007   BIL wrist   PARTIAL HYSTERECTOMY     TOTAL KNEE ARTHROPLASTY Right 01/10/2018   Procedure: RIGHT TOTAL KNEE ARTHROPLASTY;  Surgeon: Gaynelle Arabian, MD;  Location: [facility];  Service: Orthopedics;  Laterality: Right;   WRIST FRACTURE SURGERY Bilateral 2004   Social History   Social History Narrative   Not on file   Immunization History  Administered Date(s) Administered   PFIZER(Purple Top)SARS-COV-2 Vaccination 05/20/2019, 06/13/2019, 02/07/2020     Objective: Vital Signs: There were no vitals taken for this visit.   Physical Exam   Musculoskeletal Exam: ***  CDAI Exam: CDAI Score: -- Patient Global: --; Provider Global: -- Swollen: --; Tender: -- Joint Exam 05/28/2022   No joint exam has been documented for this visit   There  is currently no information documented on the homunculus. Go to the Rheumatology activity and complete the homunculus joint exam.  Investigation: No additional findings.  Imaging: No results found.  Recent Labs: Lab Results  Component Value Date   WBC 8.2 01/12/2018   HGB 11.0 (L) 01/12/2018   PLT 130 (L) 01/12/2018   NA 143 01/12/2018   K 4.2 01/12/2018   CL 108 01/12/2018    CO2 27 01/12/2018   GLUCOSE 112 (H) 01/12/2018   BUN 17 01/12/2018   CREATININE 0.70 01/12/2018   BILITOT 1.1 01/05/2018   ALKPHOS 44 01/05/2018   AST 26 01/05/2018   ALT 24 01/05/2018   PROT 6.9 01/05/2018   ALBUMIN 4.6 01/05/2018   CALCIUM 8.7 (L) 01/12/2018   GFRAA >60 01/12/2018    Speciality Comments: No specialty comments available.  Procedures:  No procedures performed Allergies: Rosanil cleanser [sulfacetamide sodium-sulfur], Sulfa antibiotics, and Sulfasalazine   Assessment / Plan:     Visit Diagnoses: No diagnosis found.  Orders: No orders of the defined types were placed in this encounter.  No orders of the defined types were placed in this encounter.   Face-to-face time spent with patient was *** minutes. Greater than 50% of time was spent in counseling and coordination of care.  Follow-Up Instructions: No follow-ups on file.   Earnestine Mealing, CMA  Note - This record has been created using Editor, commissioning.  Chart creation errors have been sought, but may not always  have been located. Such creation errors do not reflect on  the standard of medical care.

## 2022-05-28 ENCOUNTER — Ambulatory Visit: Payer: Medicare Other | Admitting: Rheumatology

## 2022-05-28 DIAGNOSIS — M1712 Unilateral primary osteoarthritis, left knee: Secondary | ICD-10-CM

## 2022-05-28 DIAGNOSIS — Z96651 Presence of right artificial knee joint: Secondary | ICD-10-CM

## 2022-05-28 DIAGNOSIS — M19071 Primary osteoarthritis, right ankle and foot: Secondary | ICD-10-CM

## 2022-05-28 DIAGNOSIS — M19042 Primary osteoarthritis, left hand: Secondary | ICD-10-CM

## 2022-05-28 DIAGNOSIS — S83006D Unspecified dislocation of unspecified patella, subsequent encounter: Secondary | ICD-10-CM

## 2022-05-28 DIAGNOSIS — M1990 Unspecified osteoarthritis, unspecified site: Secondary | ICD-10-CM

## 2022-05-28 DIAGNOSIS — M810 Age-related osteoporosis without current pathological fracture: Secondary | ICD-10-CM

## 2022-05-28 NOTE — Progress Notes (Signed)
Office Visit Note  Patient: Angela Collier             Date of Birth: 20-Apr-1945           MRN: [id number]             PCP: Mayra Neer, MD Referring: Mayra Neer, MD Visit Date: 06/11/2022 Occupation: '@GUAROCC'$ @  Subjective:  Deformities of both hands and feet   History of Present Illness: Angela Collier is a 77 y.o. female with inflammatory arthritis and osteoarthritis. Since her last office visit, she followed up with Dr. Amedeo Plenty regarding deformities of fingers and he recommended non-surgical management. She has noticed increasing deformities of her fingers but has no pain associated with these. She continues to teach yoga, walks and rides the resistance bike. She had the right knee replaced in 2019. If she overexerts herself she has swelling in both knees and some pain in the left knee.  She has stiffness in her feet but denies any discomfort.  DEXA scan in 2018 revealed osteoporosis. She is taking vitamin D daily. She does not want to be on any other treatment for osteoporosis.   Activities of Daily Living:  Patient reports morning stiffness for 0 minutes.   Patient Denies nocturnal pain.  Difficulty dressing/grooming: Denies Difficulty climbing stairs: Reports Difficulty getting out of chair: Denies Difficulty using hands for taps, buttons, cutlery, and/or writing: Denies  Review of Systems  Constitutional: Negative.  Negative for fatigue.  HENT: Negative.  Negative for mouth sores and mouth dryness.   Eyes:  Negative for dryness.  Respiratory: Negative.  Negative for shortness of breath.   Cardiovascular: Negative.  Negative for chest pain and palpitations.  Gastrointestinal: Negative.  Negative for blood in stool, constipation and diarrhea.  Endocrine: Negative.  Negative for increased urination.  Genitourinary: Negative.  Negative for involuntary urination.  Musculoskeletal:  Negative for joint pain, gait problem, joint pain, joint swelling, myalgias, muscle  weakness, morning stiffness, muscle tenderness and myalgias.  Skin: Negative.  Negative for color change, rash, hair loss and sensitivity to sunlight.  Allergic/Immunologic: Negative.  Negative for susceptible to infections.  Neurological: Negative.  Negative for dizziness and headaches.  Hematological: Negative.  Negative for swollen glands.  Psychiatric/Behavioral: Negative.  Negative for depressed mood and sleep disturbance. The patient is not nervous/anxious.     PMFS History:  Patient Active Problem List   Diagnosis Date Noted   Serous retinal detachment, left eye 06/24/2021   Exudative age-related macular degeneration of left eye with active choroidal neovascularization ([place]) 06/24/2021   Intermediate stage nonexudative age-related macular degeneration of left eye 02/08/2020   Exudative age-related macular degeneration of right eye with inactive choroidal neovascularization ([place]) 08/10/2019   Advanced nonexudative age-related macular degeneration of right eye with subfoveal involvement 08/10/2019   Serous detachment of retinal pigment epithelium of right eye 08/10/2019   Degenerative retinal drusen of both eyes 08/10/2019   Pseudophakia 08/10/2019   Osteoarthritis of right knee 01/12/2018   OA (osteoarthritis) of knee 01/10/2018   Age-related osteoporosis without current pathological fracture 08/12/2016   Osteoarthritis of both knees 02/24/2016   Knee effusion, right 02/24/2016   Patellar luxation 02/24/2016   Osteoarthritis of both feet 02/24/2016   Osteoarthritis of both hands 02/24/2016   Macular degeneration 02/24/2016   Varicose veins of lower extremities with other complications [id number]    Past Medical History:  Diagnosis Date   Arthritis    large joint    Hyperlipidemia    Macular degeneration  Osteopenia    Osteoporosis    Pneumonia    childhood age 63    Varicose veins     Family History  Problem Relation Age of Onset   Osteoarthritis Mother    Rheum  arthritis Mother    Cancer Father        liver   Heart disease Father    Hyperlipidemia Father    Hypertension Father    Heart attack Father    Other Father        varicose veins   Hyperlipidemia Sister    Healthy Daughter    Healthy Son    Past Surgical History:  Procedure Laterality Date   CATARACT EXTRACTION Right    2011   FRACTURE SURGERY  2007   BIL wrist   PARTIAL HYSTERECTOMY     TOTAL KNEE ARTHROPLASTY Right 01/10/2018   Procedure: RIGHT TOTAL KNEE ARTHROPLASTY;  Surgeon: Gaynelle Arabian, MD;  Location: [facility];  Service: Orthopedics;  Laterality: Right;   WRIST FRACTURE SURGERY Bilateral 2004   Social History   Social History Narrative   Not on file   Immunization History  Administered Date(s) Administered   PFIZER(Purple Top)SARS-COV-2 Vaccination 05/20/2019, 06/13/2019, 02/07/2020     Objective: Vital Signs: BP (!) 157/92 (BP Location: Left Arm, Patient Position: Sitting, Cuff Size: Large)   Pulse 69   Resp 16   Ht '5\' 3"'$  (1.6 m)   Wt 153 lb 3.2 oz (69.5 kg)   BMI 27.14 kg/m    Physical Exam Vitals and nursing note reviewed.  Constitutional:      Appearance: She is well-developed.  HENT:     Head: Normocephalic and atraumatic.  Eyes:     Conjunctiva/sclera: Conjunctivae normal.  Cardiovascular:     Rate and Rhythm: Normal rate and regular rhythm.     Heart sounds: Normal heart sounds.  Pulmonary:     Effort: Pulmonary effort is normal.     Breath sounds: Normal breath sounds.  Abdominal:     General: Bowel sounds are normal.     Palpations: Abdomen is soft.  Musculoskeletal:     Cervical back: Normal range of motion.  Lymphadenopathy:     Cervical: No cervical adenopathy.  Skin:    General: Skin is warm and dry.     Capillary Refill: Capillary refill takes less than 2 seconds.  Neurological:     Mental Status: She is alert and oriented to person, place, and time.  Psychiatric:        Behavior: Behavior normal.     Musculoskeletal Exam:  Cervical, thoracic and lumbar spine were in good range of motion. Shoulder joints, elbow joints, wrist joints have good range of motion with no synovitis, warmth or effusion. Subluxation of the third, fourth and fifth digits on the right and first and fourth digits on the left.  Mild inflammation of the right third MCP joint and left third and fourth PIP joint. Hip joints, knee joints, ankles, MTPs and PIPs with good range of motion.  Warmth of left knee joint noted. Hammer toes and overcrowding of the toes bilaterally. Pes planus with dorsal spurs bilaterally.   CDAI Exam: CDAI Score: -- Patient Global: --; Provider Global: -- Swollen: --; Tender: -- Joint Exam 06/11/2022   No joint exam has been documented for this visit   There is currently no information documented on the homunculus. Go to the Rheumatology activity and complete the homunculus joint exam.  Investigation: No additional findings.  Imaging: No results found.  Recent Labs: Lab Results  Component Value Date   WBC 8.2 01/12/2018   HGB 11.0 (L) 01/12/2018   PLT 130 (L) 01/12/2018   NA 143 01/12/2018   K 4.2 01/12/2018   CL 108 01/12/2018   CO2 27 01/12/2018   GLUCOSE 112 (H) 01/12/2018   BUN 17 01/12/2018   CREATININE 0.70 01/12/2018   BILITOT 1.1 01/05/2018   ALKPHOS 44 01/05/2018   AST 26 01/05/2018   ALT 24 01/05/2018   PROT 6.9 01/05/2018   ALBUMIN 4.6 01/05/2018   CALCIUM 8.7 (L) 01/12/2018   GFRAA >60 01/12/2018    Speciality Comments: No specialty comments available.  Procedures:  No procedures performed Allergies: Rosanil cleanser [sulfacetamide sodium-sulfur], Sulfa antibiotics, and Sulfasalazine   Assessment / Plan:     Visit Diagnoses: Inflammatory arthritis - Seronegative, history of recurrent knee effusions. Right total knee replacement in 2019. No effusions of the right since surgery. Has some discomfort in her left knee when climbing stairs and swelling with prolonged walking.  Warmth of  left knee joint on examination noted today. Inflammation of the right third MCP joint and third and fourth PIPs on physical exam.  She does not like taking medications.  She takes natural anti-inflammatories.  Primary osteoarthritis of both hands - Patient followed up with Dr. Amedeo Plenty for osteoarthritis of both hands and he recommended nonoperative management. Patient continues to have increasing deformities of the fingers but does not have any associated pain. Physical exam revealed subluxation of the third, fourth and fifth digits on the right and first and fourth digits on the left. Inflammation of the right third MCP joint and left third and fourth PIP joint.  She had no tenderness on the examination.  Primary osteoarthritis of left knee - Has discomfort and swelling when she overexerts herself and has some difficulty climbing stairs. Has had evaluation of the left knee by orthopedics but does not want surgery at this time. Discussed importance of continuing regular exercise.   Patellar dislocation, unspecified laterality, subsequent encounter  Status post total knee replacement, right - January 09, 2018 by Dr. Maureen Ralphs. No joint pain or swelling.  Doing well.  Primary osteoarthritis of both feet - Severe osteoarthritis of bilateral feet with bilateral MTP subluxation, bilateral pes planus with dorsal spurs and hammertoes. Not painful for the patient. Does not interfere with ADLs. Discussed the need for orthotics and metatarsal pads for the hammer toes. Recommended she follows up with a podiatrist.  Feet muscle strengthening exercises were advised.  Age-related osteoporosis without current pathological fracture - DXA on July 29, 2016 showed T score of -2.5 at [place]. Discussed the importance of repeating DEXA scan but patient declined. She regularly exercises and teaches yoga classes. She needs to continue to take calcium and vitamin D daily.   Orders: No orders of the defined types were  placed in this encounter.  No orders of the defined types were placed in this encounter.   Face-to-face time spent with patient was 30 minutes. Greater than 50% of time was spent in counseling and coordination of care.  Follow-Up Instructions: Return in about 1 year (around 06/11/2023) for Osteoarthritis.   Bo Merino, MD  Note - This record has been created using Editor, commissioning.  Chart creation errors have been sought, but may not always  have been located. Such creation errors do not reflect on  the standard of medical care.

## 2022-06-11 ENCOUNTER — Encounter: Payer: Self-pay | Admitting: Rheumatology

## 2022-06-11 ENCOUNTER — Ambulatory Visit: Payer: Medicare Other | Attending: Rheumatology | Admitting: Rheumatology

## 2022-06-11 VITALS — BP 157/92 | HR 69 | Resp 16 | Ht 63.0 in | Wt 153.2 lb

## 2022-06-11 DIAGNOSIS — M810 Age-related osteoporosis without current pathological fracture: Secondary | ICD-10-CM

## 2022-06-11 DIAGNOSIS — Z96651 Presence of right artificial knee joint: Secondary | ICD-10-CM

## 2022-06-11 DIAGNOSIS — S83006D Unspecified dislocation of unspecified patella, subsequent encounter: Secondary | ICD-10-CM

## 2022-06-11 DIAGNOSIS — M19041 Primary osteoarthritis, right hand: Secondary | ICD-10-CM

## 2022-06-11 DIAGNOSIS — M19042 Primary osteoarthritis, left hand: Secondary | ICD-10-CM

## 2022-06-11 DIAGNOSIS — M1990 Unspecified osteoarthritis, unspecified site: Secondary | ICD-10-CM | POA: Diagnosis not present

## 2022-06-11 DIAGNOSIS — M19071 Primary osteoarthritis, right ankle and foot: Secondary | ICD-10-CM | POA: Diagnosis not present

## 2022-06-11 DIAGNOSIS — M19072 Primary osteoarthritis, left ankle and foot: Secondary | ICD-10-CM

## 2022-06-11 DIAGNOSIS — M1712 Unilateral primary osteoarthritis, left knee: Secondary | ICD-10-CM | POA: Diagnosis not present

## 2022-06-17 DIAGNOSIS — H26492 Other secondary cataract, left eye: Secondary | ICD-10-CM | POA: Diagnosis not present

## 2022-06-17 DIAGNOSIS — H353221 Exudative age-related macular degeneration, left eye, with active choroidal neovascularization: Secondary | ICD-10-CM | POA: Diagnosis not present

## 2022-06-17 DIAGNOSIS — H43813 Vitreous degeneration, bilateral: Secondary | ICD-10-CM | POA: Diagnosis not present

## 2022-06-17 DIAGNOSIS — H35371 Puckering of macula, right eye: Secondary | ICD-10-CM | POA: Diagnosis not present

## 2022-06-17 DIAGNOSIS — H353212 Exudative age-related macular degeneration, right eye, with inactive choroidal neovascularization: Secondary | ICD-10-CM | POA: Diagnosis not present

## 2022-08-08 ENCOUNTER — Encounter (HOSPITAL_BASED_OUTPATIENT_CLINIC_OR_DEPARTMENT_OTHER): Payer: Self-pay | Admitting: Emergency Medicine

## 2022-08-08 ENCOUNTER — Emergency Department (HOSPITAL_BASED_OUTPATIENT_CLINIC_OR_DEPARTMENT_OTHER): Payer: Medicare Other

## 2022-08-08 ENCOUNTER — Other Ambulatory Visit: Payer: Self-pay

## 2022-08-08 ENCOUNTER — Emergency Department (HOSPITAL_BASED_OUTPATIENT_CLINIC_OR_DEPARTMENT_OTHER)
Admission: EM | Admit: 2022-08-08 | Discharge: 2022-08-08 | Disposition: A | Discharge status: Home / Self Care | Payer: Medicare Other | Attending: Emergency Medicine | Admitting: Emergency Medicine

## 2022-08-08 DIAGNOSIS — S7001XA Contusion of right hip, initial encounter: Secondary | ICD-10-CM | POA: Diagnosis not present

## 2022-08-08 DIAGNOSIS — W010XXA Fall on same level from slipping, tripping and stumbling without subsequent striking against object, initial encounter: Secondary | ICD-10-CM | POA: Insufficient documentation

## 2022-08-08 DIAGNOSIS — Z043 Encounter for examination and observation following other accident: Secondary | ICD-10-CM | POA: Diagnosis not present

## 2022-08-08 DIAGNOSIS — M25551 Pain in right hip: Secondary | ICD-10-CM | POA: Diagnosis present

## 2022-08-08 DIAGNOSIS — Y9289 Other specified places as the place of occurrence of the external cause: Secondary | ICD-10-CM | POA: Insufficient documentation

## 2022-08-08 NOTE — Discharge Instructions (Addendum)
For your pain, you may take up to 1000mg  of acetaminophen (tylenol) 4 times daily for up to a week. This is the maximum dose of acetminophen (tylenol) you can take from all sources. Please check other over-the-counter medications and prescriptions to ensure you are not taking other medications that contain acetaminophen.  You may also take ibuprofen 400 mg 6 times a day OR 600mg  4 times a day alternating with or at the same time as tylenol.  You can use these in addition to a lidocaine patch.

## 2022-08-08 NOTE — ED Notes (Signed)
Discharge instructions, pain medications and follow up care reviewed and explained. Pt verbalized understanding and had no further questions.

## 2022-08-08 NOTE — ED Provider Notes (Addendum)
[name] Provider Note   CSN: [id number] Arrival date & time: 08/08/22  4098     History  No chief complaint on file.   Angela Collier is a 77 y.o. female.  HPI     77yo female with a history of hyperlipidemia, macular degeneration, inflammatory and osteoarthritis, who presents with concern for fall with right hip pain.  Pain in thigh/hip  [place] [facility] [place] [employer] slipped on the floor and right leg went shooting out, landed more on thigh than on hip/trochanter, went into the water of the pool, did gentle exercise, went home, out again, dr appointment, then laid down to rest and was laying down and after an hour and a half was severe pain. Took some tylenol and it was still severe all last evening, every time get up to go to the bathroom would have pain, stomach felt nervous. Did not have any diaphoresis or stomach nervousness today.  No chest pain or dyspnea. Leg is swollen and painful. Able to put weight on it but with pain.  Took tylenol at 530AM so it feels a little better.    No head trauma, LOC, neck pain, other arm or leg pain Has arthritis, teaches yoga    Past Medical History:  Diagnosis Date   Arthritis    large joint    Hyperlipidemia    Macular degeneration    Osteopenia    Osteoporosis    Pneumonia    childhood age 20    Varicose veins       Home Medications Prior to Admission medications   Medication Sig Start Date End Date Taking? Authorizing Provider  CALCIUM PO Take by mouth. Patient not taking: Reported on 06/11/2022    [provider]  carboxymethylcellulose (REFRESH PLUS) 0.5 % SOLN Place 1 drop into both eyes 3 (three) times daily as needed (for dry eyes.).    [provider]  Coenzyme Q10 (CO Q 10 PO) Take by mouth.    [provider]  COLLAGEN PO Take by mouth. Patient not taking: Reported on 05/28/2021    [provider]  lidocaine  (XYLOCAINE) 2 % solution Lidocaine Viscous 2 % mucosal solution  GARGLE 5 MLS EVERY 2 HOURS AS NEEDED Patient not taking: Reported on 06/11/2022    [provider]  MAGNESIUM PO Take by mouth.    [provider]  NON FORMULARY Take 1 Device by mouth at bedtime. EMA oral device for the treatment of macular degeneration    [provider]  Omega-3 Fatty Acids (FISH OIL PO) Take by mouth daily.    [provider]  Red Yeast Rice Extract (RED YEAST RICE PO) Take by mouth 2 (two) times daily.    [provider]  Specialty Vitamins Products (COLLAGEN ULTRA) CAPS See admin instructions. Patient not taking: Reported on 05/28/2021    [provider]  TURMERIC PO Take by mouth.    [provider]  VITAMIN D PO Take by mouth 2 (two) times daily.    [provider]  VITAMIN E PO Take by mouth.    [provider]      Allergies    Rosanil cleanser [sulfacetamide sodium-sulfur], Sulfa antibiotics, and Sulfasalazine    Review of Systems   Review of Systems  Physical Exam Updated Vital Signs BP 135/72   Pulse 81   Temp 97.9 F (36.6 C) (Oral)   Resp 20  SpO2 99%  Physical Exam Vitals and nursing note reviewed.  Constitutional:      General: She is not in acute distress.    Appearance: She is well-developed. She is not diaphoretic.  HENT:     Head: Normocephalic and atraumatic.  Eyes:     Conjunctiva/sclera: Conjunctivae normal.  Cardiovascular:     Rate and Rhythm: Normal rate and regular rhythm.     Heart sounds: Normal heart sounds. No murmur heard.    No friction rub. No gallop.  Pulmonary:     Effort: Pulmonary effort is normal. No respiratory distress.     Breath sounds: Normal breath sounds. No wheezing or rales.  Abdominal:     General: There is no distension.     Palpations: Abdomen is soft.     Tenderness: There is no abdominal tenderness. There is no guarding.  Musculoskeletal:        General:  Tenderness (right greater trochanter, approx12x8cm contusion just distal and slightly posterior to right greater trochanter with tenderness, no tenderness to c/t/l spine) present.     Cervical back: Normal range of motion.  Skin:    General: Skin is warm and dry.     Findings: No erythema or rash.  Neurological:     Mental Status: She is alert and oriented to person, place, and time.     ED Results / Procedures / Treatments   Labs (all labs ordered are listed, but only abnormal results are displayed) Labs Reviewed - No data to display  EKG None  Radiology DG Hip Unilat W or Wo Pelvis 2-3 Views Right  Result Date: 08/08/2022 CLINICAL DATA:  Fall. EXAM: DG HIP (WITH OR WITHOUT PELVIS) 3V RIGHT COMPARISON:  None Available. FINDINGS: There is no evidence of hip fracture or dislocation. There is no evidence of arthropathy or other focal bone abnormality. IMPRESSION: Negative right hip radiographs. Electronically Signed   By: Angela Collier M.D.   On: 08/08/2022 08:15    Procedures Procedures    Medications Ordered in ED Medications - No data to display  ED Course/ Medical Decision Making/ A&P                               [phone] female with a history of hyperlipidemia, macular degeneration, inflammatory and osteoarthritis, who presents with concern for fall with right hip pain.  Did not hit head, is not on anticoagulation, no headache, nausea or vomiting, no neck pain, back pain, chest pain and have low suspicion for other intracranial, spinal intrathoracic or intra-abdominal injuries.  Did have some sensation of stomach nervousness and sweating last night with the pain in her leg as she walked to the bathroom, but does not have that today.  Have low suspicion for ACS.  She has normal pulses bilaterally.  She has a large contusion over her right hip/lateral proximal thigh.  XR hip obtained and personally evaluated and interpreted by me shows no acute osseous injuries.  Has  been bearing weight, did not have severe pain initially, and doubt occult fracture.  Suspect pain related to large contusion. Recommend continued tylenol/ibuprofen and lidoderm patch. Patient discharged in stable condition with understanding of reasons to return.         Final Clinical Impression(s) / ED Diagnoses Final diagnoses:  Contusion of right hip, initial encounter    Rx / DC Orders ED Discharge Orders     None  Alvira Monday, MD 08/08/22 1610    Alvira Monday, MD 08/08/22 [phone]

## 2022-08-08 NOTE — ED Triage Notes (Signed)
Pt slipped and fell yesterday at the pool, landing on her right thigh. Pt reports pain was bearable until last night, right posterior thigh became swollen.and so painful she couldn't walk. No blood thinners

## 2022-08-27 DIAGNOSIS — H353221 Exudative age-related macular degeneration, left eye, with active choroidal neovascularization: Secondary | ICD-10-CM | POA: Diagnosis not present

## 2022-08-27 DIAGNOSIS — H353122 Nonexudative age-related macular degeneration, left eye, intermediate dry stage: Secondary | ICD-10-CM | POA: Diagnosis not present

## 2022-08-27 DIAGNOSIS — H35363 Drusen (degenerative) of macula, bilateral: Secondary | ICD-10-CM | POA: Diagnosis not present

## 2022-08-27 DIAGNOSIS — H353112 Nonexudative age-related macular degeneration, right eye, intermediate dry stage: Secondary | ICD-10-CM | POA: Diagnosis not present

## 2022-08-27 DIAGNOSIS — H353212 Exudative age-related macular degeneration, right eye, with inactive choroidal neovascularization: Secondary | ICD-10-CM | POA: Diagnosis not present

## 2022-09-28 DIAGNOSIS — M9903 Segmental and somatic dysfunction of lumbar region: Secondary | ICD-10-CM | POA: Diagnosis not present

## 2022-09-28 DIAGNOSIS — M9901 Segmental and somatic dysfunction of cervical region: Secondary | ICD-10-CM | POA: Diagnosis not present

## 2022-11-03 DIAGNOSIS — H353122 Nonexudative age-related macular degeneration, left eye, intermediate dry stage: Secondary | ICD-10-CM | POA: Diagnosis not present

## 2022-11-03 DIAGNOSIS — H3322 Serous retinal detachment, left eye: Secondary | ICD-10-CM | POA: Diagnosis not present

## 2022-11-03 DIAGNOSIS — H353212 Exudative age-related macular degeneration, right eye, with inactive choroidal neovascularization: Secondary | ICD-10-CM | POA: Diagnosis not present

## 2022-11-03 DIAGNOSIS — H353221 Exudative age-related macular degeneration, left eye, with active choroidal neovascularization: Secondary | ICD-10-CM | POA: Diagnosis not present

## 2023-01-27 DIAGNOSIS — H3322 Serous retinal detachment, left eye: Secondary | ICD-10-CM | POA: Diagnosis not present

## 2023-01-27 DIAGNOSIS — H353114 Nonexudative age-related macular degeneration, right eye, advanced atrophic with subfoveal involvement: Secondary | ICD-10-CM | POA: Diagnosis not present

## 2023-01-27 DIAGNOSIS — H353122 Nonexudative age-related macular degeneration, left eye, intermediate dry stage: Secondary | ICD-10-CM | POA: Diagnosis not present

## 2023-01-27 DIAGNOSIS — H353221 Exudative age-related macular degeneration, left eye, with active choroidal neovascularization: Secondary | ICD-10-CM | POA: Diagnosis not present

## 2023-01-27 DIAGNOSIS — H353212 Exudative age-related macular degeneration, right eye, with inactive choroidal neovascularization: Secondary | ICD-10-CM | POA: Diagnosis not present

## 2023-01-27 DIAGNOSIS — H35721 Serous detachment of retinal pigment epithelium, right eye: Secondary | ICD-10-CM | POA: Diagnosis not present

## 2023-01-28 ENCOUNTER — Telehealth: Payer: Self-pay | Admitting: *Deleted

## 2023-01-28 DIAGNOSIS — M19042 Primary osteoarthritis, left hand: Secondary | ICD-10-CM

## 2023-01-28 NOTE — Telephone Encounter (Signed)
Patient advised referral placed

## 2023-01-28 NOTE — Addendum Note (Signed)
Addended by: Henriette Combs on: 01/28/2023 09:52 AM   Modules accepted: Orders

## 2023-01-28 NOTE — Telephone Encounter (Signed)
Okay to place the referral?

## 2023-01-28 NOTE — Telephone Encounter (Signed)
Patient contacted the office stating she has lost her ring splint for her left index finger. [facility] is [facility]. Please advise.

## 2023-02-02 DIAGNOSIS — Z1231 Encounter for screening mammogram for malignant neoplasm of breast: Secondary | ICD-10-CM | POA: Diagnosis not present

## 2023-02-16 DIAGNOSIS — M25642 Stiffness of left hand, not elsewhere classified: Secondary | ICD-10-CM | POA: Diagnosis not present

## 2023-02-17 DIAGNOSIS — H353212 Exudative age-related macular degeneration, right eye, with inactive choroidal neovascularization: Secondary | ICD-10-CM | POA: Diagnosis not present

## 2023-02-17 DIAGNOSIS — Z1211 Encounter for screening for malignant neoplasm of colon: Secondary | ICD-10-CM | POA: Diagnosis not present

## 2023-02-17 DIAGNOSIS — M810 Age-related osteoporosis without current pathological fracture: Secondary | ICD-10-CM | POA: Diagnosis not present

## 2023-02-17 DIAGNOSIS — R7301 Impaired fasting glucose: Secondary | ICD-10-CM | POA: Diagnosis not present

## 2023-02-17 DIAGNOSIS — M1990 Unspecified osteoarthritis, unspecified site: Secondary | ICD-10-CM | POA: Diagnosis not present

## 2023-02-17 DIAGNOSIS — E7800 Pure hypercholesterolemia, unspecified: Secondary | ICD-10-CM | POA: Diagnosis not present

## 2023-02-17 DIAGNOSIS — Z9181 History of falling: Secondary | ICD-10-CM | POA: Diagnosis not present

## 2023-02-17 DIAGNOSIS — Z0000 Encounter for general adult medical examination without abnormal findings: Secondary | ICD-10-CM | POA: Diagnosis not present

## 2023-02-17 DIAGNOSIS — Z23 Encounter for immunization: Secondary | ICD-10-CM | POA: Diagnosis not present

## 2023-02-17 DIAGNOSIS — H353221 Exudative age-related macular degeneration, left eye, with active choroidal neovascularization: Secondary | ICD-10-CM | POA: Diagnosis not present

## 2023-05-06 DIAGNOSIS — H353114 Nonexudative age-related macular degeneration, right eye, advanced atrophic with subfoveal involvement: Secondary | ICD-10-CM | POA: Diagnosis not present

## 2023-05-06 DIAGNOSIS — H35721 Serous detachment of retinal pigment epithelium, right eye: Secondary | ICD-10-CM | POA: Diagnosis not present

## 2023-05-06 DIAGNOSIS — H353212 Exudative age-related macular degeneration, right eye, with inactive choroidal neovascularization: Secondary | ICD-10-CM | POA: Diagnosis not present

## 2023-05-06 DIAGNOSIS — H3322 Serous retinal detachment, left eye: Secondary | ICD-10-CM | POA: Diagnosis not present

## 2023-05-06 DIAGNOSIS — H353122 Nonexudative age-related macular degeneration, left eye, intermediate dry stage: Secondary | ICD-10-CM | POA: Diagnosis not present

## 2023-05-06 DIAGNOSIS — H353221 Exudative age-related macular degeneration, left eye, with active choroidal neovascularization: Secondary | ICD-10-CM | POA: Diagnosis not present

## 2023-06-01 NOTE — Progress Notes (Signed)
 Office Visit Note  Patient: Angela Collier             Date of Birth: 1945/09/08           MRN: [id number]             PCP: Lupita Raider, MD Referring: Lupita Raider, MD Visit Date: 06/15/2023 Occupation: @GUAROCC @  Subjective:  Stiffness in joints.  History of Present Illness: Angela Collier is a 78 y.o. female with inflammatory arthritis and osteoarthritis overlap.  She returns today after her last visit in February 2024.  She states she continues to have some pain and stiffness in her hands which does not interfere with her routine activities.  She has been practicing yoga and dancing.  She also exercises on a regular basis.  She had good results from right total knee replacement.  She has occasional discomfort in her left knee joint.  She states that despite having deformities in her feet she has no restrictions and doing her activities.  She has modified her shoes.  She decided not to have repeat DEXA scan as she is not certain that she will take medications for osteoporosis.    Activities of Daily Living:  Patient reports morning stiffness for a few minutes.   Patient Denies nocturnal pain.  Difficulty dressing/grooming: Denies Difficulty climbing stairs: Reports Difficulty getting out of chair: Denies Difficulty using hands for taps, buttons, cutlery, and/or writing: Reports  Review of Systems  Constitutional:  Negative for fatigue.  HENT:  Positive for mouth sores. Negative for mouth dryness.   Eyes:  Positive for photophobia. Negative for dryness.  Respiratory:  Negative for shortness of breath.   Cardiovascular:  Negative for chest pain and palpitations.  Gastrointestinal:  Negative for blood in stool, constipation and diarrhea.  Endocrine: Negative for increased urination.  Genitourinary:  Negative for involuntary urination.  Musculoskeletal:  Positive for joint pain, joint pain and morning stiffness. Negative for gait problem, joint swelling, myalgias, muscle  weakness, muscle tenderness and myalgias.  Skin:  Negative for color change, rash, hair loss and sensitivity to sunlight.  Allergic/Immunologic: Negative for susceptible to infections.  Neurological:  Negative for dizziness and headaches.  Hematological:  Negative for swollen glands.  Psychiatric/Behavioral:  Negative for depressed mood and sleep disturbance. The patient is not nervous/anxious.     PMFS History:  Patient Active Problem List   Diagnosis Date Noted   Serous retinal detachment, left eye 06/24/2021   Exudative age-related macular degeneration of left eye with active choroidal neovascularization (HCC) 06/24/2021   Intermediate stage nonexudative age-related macular degeneration of left eye 02/08/2020   Exudative age-related macular degeneration of right eye with inactive choroidal neovascularization (HCC) 08/10/2019   Advanced nonexudative age-related macular degeneration of right eye with subfoveal involvement 08/10/2019   Serous detachment of retinal pigment epithelium of right eye 08/10/2019   Degenerative retinal drusen of both eyes 08/10/2019   Pseudophakia 08/10/2019   Osteoarthritis of right knee 01/12/2018   OA (osteoarthritis) of knee 01/10/2018   Age-related osteoporosis without current pathological fracture 08/12/2016   Osteoarthritis of both knees 02/24/2016   Knee effusion, right 02/24/2016   Patellar luxation 02/24/2016   Osteoarthritis of both feet 02/24/2016   Osteoarthritis of both hands 02/24/2016   Macular degeneration 02/24/2016   Varicose veins of bilateral lower extremities with other complications 12/21/2012    Past Medical History:  Diagnosis Date   Arthritis    large joint    Hyperlipidemia    Macular degeneration  Osteopenia    Osteoporosis    Pneumonia    childhood age 70    Varicose veins     Family History  Problem Relation Age of Onset   Osteoarthritis Mother    Rheum arthritis Mother    Cancer Father        liver   Heart  disease Father    Hyperlipidemia Father    Hypertension Father    Heart attack Father    Other Father        varicose veins   Hyperlipidemia Sister    Macular degeneration Sister    Healthy Daughter    Healthy Son    Past Surgical History:  Procedure Laterality Date   CATARACT EXTRACTION Right    2011   FRACTURE SURGERY  2007   BIL wrist   PARTIAL HYSTERECTOMY     TOTAL KNEE ARTHROPLASTY Right 01/10/2018   Procedure: RIGHT TOTAL KNEE ARTHROPLASTY;  Surgeon: Ollen Gross, MD;  Location: [facility];  Service: Orthopedics;  Laterality: Right;   WRIST FRACTURE SURGERY Bilateral 2004   Social History   Social History Narrative   Not on file   Immunization History  Administered Date(s) Administered   PFIZER(Purple Top)SARS-COV-2 Vaccination 05/20/2019, 06/13/2019, 02/07/2020     Objective: Vital Signs: BP (!) 150/88 (BP Location: Left Arm, Patient Position: Sitting, Cuff Size: Normal)   Pulse 65   Resp 14   Ht 5' 2.5" (1.588 m)   Wt 154 lb 6.4 oz (70 kg)   BMI 27.79 kg/m    Physical Exam Vitals and nursing note reviewed.  Constitutional:      Appearance: She is well-developed.  HENT:     Head: Normocephalic and atraumatic.  Eyes:     Conjunctiva/sclera: Conjunctivae normal.  Cardiovascular:     Rate and Rhythm: Normal rate and regular rhythm.     Heart sounds: Normal heart sounds.  Pulmonary:     Effort: Pulmonary effort is normal.     Breath sounds: Normal breath sounds.  Abdominal:     General: Bowel sounds are normal.     Palpations: Abdomen is soft.  Musculoskeletal:     Cervical back: Normal range of motion.  Lymphadenopathy:     Cervical: No cervical adenopathy.  Skin:    General: Skin is warm and dry.     Capillary Refill: Capillary refill takes less than 2 seconds.  Neurological:     Mental Status: She is alert and oriented to person, place, and time.  Psychiatric:        Behavior: Behavior normal.      Musculoskeletal Exam: Cervical, thoracic  and lumbar spine were in good range of motion.  She had no difficulty reaching the floor with the palm of her hands.  Shoulders, elbows, wrist joints with good range of motion.  She had synovial thickening over bilateral MCP joints, PIP and DIP joints.  She had contractures of some of her PIP joints.  Inflammatory changes were noted in the bilateral second and third MCP joints and some of the PIP joints.  Hip joints and knee joints in good range of motion.  Right total knee replacement was doing well.  There was no warmth or swelling in her left knee joint.  There was no tenderness over ankles.  She had bilateral severe hallux valgus and overcrowding of the toes.  Bilateral pes planus was noted.  CDAI Exam: CDAI Score: -- Patient Global: --; Provider Global: -- Swollen: --; Tender: -- Joint Exam 06/15/2023  No joint exam has been documented for this visit   There is currently no information documented on the homunculus. Go to the Rheumatology activity and complete the homunculus joint exam.  Investigation: No additional findings.  Imaging: No results found.  Recent Labs: Lab Results  Component Value Date   WBC 8.2 01/12/2018   HGB 11.0 (L) 01/12/2018   PLT 130 (L) 01/12/2018   NA 143 01/12/2018   K 4.2 01/12/2018   CL 108 01/12/2018   CO2 27 01/12/2018   GLUCOSE 112 (H) 01/12/2018   BUN 17 01/12/2018   CREATININE 0.70 01/12/2018   BILITOT 1.1 01/05/2018   ALKPHOS 44 01/05/2018   AST 26 01/05/2018   ALT 24 01/05/2018   PROT 6.9 01/05/2018   ALBUMIN 4.6 01/05/2018   CALCIUM 8.7 (L) 01/12/2018   GFRAA >60 01/12/2018    Speciality Comments: No specialty comments available.  Procedures:  No procedures performed Allergies: Rosanil cleanser [sulfacetamide sodium-sulfur], Sulfa antibiotics, and Sulfasalazine   Assessment / Plan:     Visit Diagnoses: Inflammatory arthritis - Seronegative inflammatory arthritis for many years.  She used to get recurrent knee joint effusions in  the past but has not had any recent episodes of knee joint effusion.  She continues to have some inflammation in her hands especially over her MCPs and PIP joints.  She does not like taking pharmacological agents and has been taking natural anti-inflammatories.  Right total knee replacement in 2019. No effusions of the right since surgery.  Primary osteoarthritis of both hands -she continues to have some stiffness in her hands.  Inflammation was noted in the bilateral second and third MCP joints and PIP joints.  She may have inflammatory osteoarthritis or crystal induced arthropathy or seronegative rheumatoid arthritis.  She is not interested in taking any medicines at this time.  She was also evaluated by Dr. Amanda Pea last year who diagnosed her with osteoarthritis and did not advise surgery.  I offered repeat x-rays which she declined.  A handout on hand exercises was given.  Joint protection was discussed.  Primary osteoarthritis of left knee -she has intermittent discomfort in her left knee joint.  No warmth swelling or effusion was noted today.  Patellar dislocation, unspecified laterality, subsequent encounter  Status post total knee replacement, right - January 09, 2018 by Dr. Despina Hick.  Doing well.  Primary osteoarthritis of both feet - Severe osteoarthritis of bilateral feet with bilateral MTP subluxation, bilateral pes planus with dorsal spurs and hammertoes.  She is wearing shoes to accommodate deformities.  She states her arthritis does not interfere with her routine activities and yoga practice.  Age-related osteoporosis without current pathological fracture - DXA on July 29, 2016 showed T score of -2.5 at [facility] physicians.  Patient states she is not interested in taking the treatment for osteoporosis and would prefer not to have DEXA scan at this point but she would think about it.  Orders: No orders of the defined types were placed in this encounter.  No orders of the defined types  were placed in this encounter.    Follow-Up Instructions: Return in about 6 months (around 12/16/2023) for Osteoarthritis.   Pollyann Savoy, MD  Note - This record has been created using Animal nutritionist.  Chart creation errors have been sought, but may not always  have been located. Such creation errors do not reflect on  the standard of medical care.

## 2023-06-15 ENCOUNTER — Ambulatory Visit: Payer: Medicare Other | Attending: Rheumatology | Admitting: Rheumatology

## 2023-06-15 ENCOUNTER — Encounter: Payer: Self-pay | Admitting: Rheumatology

## 2023-06-15 VITALS — BP 150/88 | HR 65 | Resp 14 | Ht 62.5 in | Wt 154.4 lb

## 2023-06-15 DIAGNOSIS — M19071 Primary osteoarthritis, right ankle and foot: Secondary | ICD-10-CM

## 2023-06-15 DIAGNOSIS — M19041 Primary osteoarthritis, right hand: Secondary | ICD-10-CM | POA: Diagnosis not present

## 2023-06-15 DIAGNOSIS — Z96651 Presence of right artificial knee joint: Secondary | ICD-10-CM | POA: Diagnosis not present

## 2023-06-15 DIAGNOSIS — M810 Age-related osteoporosis without current pathological fracture: Secondary | ICD-10-CM

## 2023-06-15 DIAGNOSIS — M1990 Unspecified osteoarthritis, unspecified site: Secondary | ICD-10-CM | POA: Diagnosis not present

## 2023-06-15 DIAGNOSIS — M1712 Unilateral primary osteoarthritis, left knee: Secondary | ICD-10-CM | POA: Diagnosis not present

## 2023-06-15 DIAGNOSIS — M19072 Primary osteoarthritis, left ankle and foot: Secondary | ICD-10-CM | POA: Diagnosis not present

## 2023-06-15 DIAGNOSIS — S83006D Unspecified dislocation of unspecified patella, subsequent encounter: Secondary | ICD-10-CM

## 2023-06-15 DIAGNOSIS — M19042 Primary osteoarthritis, left hand: Secondary | ICD-10-CM

## 2023-06-15 NOTE — Patient Instructions (Signed)

## 2023-07-15 DIAGNOSIS — K5730 Diverticulosis of large intestine without perforation or abscess without bleeding: Secondary | ICD-10-CM | POA: Diagnosis not present

## 2023-07-15 DIAGNOSIS — Z800 Family history of malignant neoplasm of digestive organs: Secondary | ICD-10-CM | POA: Diagnosis not present

## 2023-07-28 DIAGNOSIS — M9903 Segmental and somatic dysfunction of lumbar region: Secondary | ICD-10-CM | POA: Diagnosis not present

## 2023-07-28 DIAGNOSIS — M9901 Segmental and somatic dysfunction of cervical region: Secondary | ICD-10-CM | POA: Diagnosis not present

## 2023-08-05 DIAGNOSIS — H3322 Serous retinal detachment, left eye: Secondary | ICD-10-CM | POA: Diagnosis not present

## 2023-08-05 DIAGNOSIS — H353114 Nonexudative age-related macular degeneration, right eye, advanced atrophic with subfoveal involvement: Secondary | ICD-10-CM | POA: Diagnosis not present

## 2023-08-05 DIAGNOSIS — H353231 Exudative age-related macular degeneration, bilateral, with active choroidal neovascularization: Secondary | ICD-10-CM | POA: Diagnosis not present

## 2023-08-05 DIAGNOSIS — H35721 Serous detachment of retinal pigment epithelium, right eye: Secondary | ICD-10-CM | POA: Diagnosis not present

## 2023-08-05 DIAGNOSIS — H353122 Nonexudative age-related macular degeneration, left eye, intermediate dry stage: Secondary | ICD-10-CM | POA: Diagnosis not present

## 2023-09-16 DIAGNOSIS — H353114 Nonexudative age-related macular degeneration, right eye, advanced atrophic with subfoveal involvement: Secondary | ICD-10-CM | POA: Diagnosis not present

## 2023-09-16 DIAGNOSIS — H353221 Exudative age-related macular degeneration, left eye, with active choroidal neovascularization: Secondary | ICD-10-CM | POA: Diagnosis not present

## 2023-09-16 DIAGNOSIS — H353122 Nonexudative age-related macular degeneration, left eye, intermediate dry stage: Secondary | ICD-10-CM | POA: Diagnosis not present

## 2023-09-16 DIAGNOSIS — H35721 Serous detachment of retinal pigment epithelium, right eye: Secondary | ICD-10-CM | POA: Diagnosis not present

## 2023-09-16 DIAGNOSIS — H3322 Serous retinal detachment, left eye: Secondary | ICD-10-CM | POA: Diagnosis not present

## 2023-09-16 DIAGNOSIS — H353211 Exudative age-related macular degeneration, right eye, with active choroidal neovascularization: Secondary | ICD-10-CM | POA: Diagnosis not present

## 2023-09-23 DIAGNOSIS — I8391 Asymptomatic varicose veins of right lower extremity: Secondary | ICD-10-CM | POA: Diagnosis not present

## 2023-09-23 DIAGNOSIS — D1721 Benign lipomatous neoplasm of skin and subcutaneous tissue of right arm: Secondary | ICD-10-CM | POA: Diagnosis not present

## 2023-09-23 DIAGNOSIS — D2272 Melanocytic nevi of left lower limb, including hip: Secondary | ICD-10-CM | POA: Diagnosis not present

## 2023-09-23 DIAGNOSIS — D1801 Hemangioma of skin and subcutaneous tissue: Secondary | ICD-10-CM | POA: Diagnosis not present

## 2023-09-23 DIAGNOSIS — D225 Melanocytic nevi of trunk: Secondary | ICD-10-CM | POA: Diagnosis not present

## 2023-09-23 DIAGNOSIS — I8392 Asymptomatic varicose veins of left lower extremity: Secondary | ICD-10-CM | POA: Diagnosis not present

## 2023-09-23 DIAGNOSIS — D3612 Benign neoplasm of peripheral nerves and autonomic nervous system, upper limb, including shoulder: Secondary | ICD-10-CM | POA: Diagnosis not present

## 2023-09-23 DIAGNOSIS — L821 Other seborrheic keratosis: Secondary | ICD-10-CM | POA: Diagnosis not present

## 2023-09-23 DIAGNOSIS — L814 Other melanin hyperpigmentation: Secondary | ICD-10-CM | POA: Diagnosis not present

## 2023-09-23 DIAGNOSIS — L820 Inflamed seborrheic keratosis: Secondary | ICD-10-CM | POA: Diagnosis not present

## 2023-10-28 DIAGNOSIS — H3322 Serous retinal detachment, left eye: Secondary | ICD-10-CM | POA: Diagnosis not present

## 2023-10-28 DIAGNOSIS — H353114 Nonexudative age-related macular degeneration, right eye, advanced atrophic with subfoveal involvement: Secondary | ICD-10-CM | POA: Diagnosis not present

## 2023-10-28 DIAGNOSIS — H35721 Serous detachment of retinal pigment epithelium, right eye: Secondary | ICD-10-CM | POA: Diagnosis not present

## 2023-10-28 DIAGNOSIS — H353221 Exudative age-related macular degeneration, left eye, with active choroidal neovascularization: Secondary | ICD-10-CM | POA: Diagnosis not present

## 2023-10-28 DIAGNOSIS — H353211 Exudative age-related macular degeneration, right eye, with active choroidal neovascularization: Secondary | ICD-10-CM | POA: Diagnosis not present

## 2023-10-28 DIAGNOSIS — H353122 Nonexudative age-related macular degeneration, left eye, intermediate dry stage: Secondary | ICD-10-CM | POA: Diagnosis not present

## 2023-11-04 DIAGNOSIS — H353221 Exudative age-related macular degeneration, left eye, with active choroidal neovascularization: Secondary | ICD-10-CM | POA: Diagnosis not present

## 2023-11-04 DIAGNOSIS — H353114 Nonexudative age-related macular degeneration, right eye, advanced atrophic with subfoveal involvement: Secondary | ICD-10-CM | POA: Diagnosis not present

## 2023-11-04 DIAGNOSIS — H353122 Nonexudative age-related macular degeneration, left eye, intermediate dry stage: Secondary | ICD-10-CM | POA: Diagnosis not present

## 2023-11-04 DIAGNOSIS — H3322 Serous retinal detachment, left eye: Secondary | ICD-10-CM | POA: Diagnosis not present

## 2023-11-04 DIAGNOSIS — H353211 Exudative age-related macular degeneration, right eye, with active choroidal neovascularization: Secondary | ICD-10-CM | POA: Diagnosis not present

## 2023-11-04 DIAGNOSIS — H35721 Serous detachment of retinal pigment epithelium, right eye: Secondary | ICD-10-CM | POA: Diagnosis not present

## 2023-11-18 DIAGNOSIS — M25562 Pain in left knee: Secondary | ICD-10-CM | POA: Diagnosis not present

## 2023-11-18 DIAGNOSIS — B356 Tinea cruris: Secondary | ICD-10-CM | POA: Diagnosis not present

## 2023-11-24 DIAGNOSIS — M9903 Segmental and somatic dysfunction of lumbar region: Secondary | ICD-10-CM | POA: Diagnosis not present

## 2023-11-25 DIAGNOSIS — M9903 Segmental and somatic dysfunction of lumbar region: Secondary | ICD-10-CM | POA: Diagnosis not present

## 2023-12-07 NOTE — Progress Notes (Signed)
 Office Visit Note  Patient: Angela Collier             Date of Birth: 1945-10-19           MRN: [id number]             PCP: Loreli Kins, MD Referring: Loreli Kins, MD Visit Date: 12/21/2023 Occupation: @GUAROCC @  Subjective:  Pain in joints  History of Present Illness: Angela Collier is a 78 y.o. female with inflammatory osteoarthritis.  She returns today after her last visit in March 2025.  She states she has been going to physical therapy and doing water  aerobics.  She also does yoga and dancing on a regular basis.  As she states she does fine inside the water  but when she comes out she has problems balancing due to left knee joint discomfort.  She also continues to have some discomfort in her right ankle and right foot.  She continues to have problems with overcrowding of her toes.  She has new orthotics in her shoes.  She has some discomfort in her hands.  She denies any joint swelling.    Activities of Daily Living:  Patient reports morning stiffness for 2-3 minutes.   Patient Denies nocturnal pain.  Difficulty dressing/grooming: Denies Difficulty climbing stairs: Reports Difficulty getting out of chair: Denies Difficulty using hands for taps, buttons, cutlery, and/or writing: Reports  Review of Systems  Constitutional:  Negative for fatigue.  HENT:  Positive for mouth sores. Negative for mouth dryness.   Eyes:  Negative for dryness.  Respiratory:  Negative for shortness of breath.   Cardiovascular:  Negative for chest pain and palpitations.  Gastrointestinal:  Negative for blood in stool, constipation and diarrhea.  Endocrine: Negative for increased urination.  Genitourinary:  Negative for involuntary urination.  Musculoskeletal:  Positive for gait problem, muscle weakness and morning stiffness. Negative for joint pain, joint pain, joint swelling, myalgias, muscle tenderness and myalgias.  Skin:  Negative for color change, rash, hair loss and sensitivity to  sunlight.  Allergic/Immunologic: Negative for susceptible to infections.  Neurological:  Negative for dizziness and headaches.  Hematological:  Negative for swollen glands.  Psychiatric/Behavioral:  Negative for depressed mood and sleep disturbance. The patient is not nervous/anxious.     PMFS History:  Patient Active Problem List   Diagnosis Date Noted   Serous retinal detachment, left eye 06/24/2021   Exudative age-related macular degeneration of left eye with active choroidal neovascularization (HCC) 06/24/2021   Intermediate stage nonexudative age-related macular degeneration of left eye 02/08/2020   Exudative age-related macular degeneration of right eye with inactive choroidal neovascularization (HCC) 08/10/2019   Advanced nonexudative age-related macular degeneration of right eye with subfoveal involvement 08/10/2019   Serous detachment of retinal pigment epithelium of right eye 08/10/2019   Degenerative retinal drusen of both eyes 08/10/2019   Pseudophakia 08/10/2019   Osteoarthritis of right knee 01/12/2018   OA (osteoarthritis) of knee 01/10/2018   Age-related osteoporosis without current pathological fracture 08/12/2016   Osteoarthritis of both knees 02/24/2016   Knee effusion, right 02/24/2016   Patellar luxation 02/24/2016   Osteoarthritis of both feet 02/24/2016   Osteoarthritis of both hands 02/24/2016   Macular degeneration 02/24/2016   Varicose veins of bilateral lower extremities with other complications 12/21/2012    Past Medical History:  Diagnosis Date   Arthritis    large joint    Hyperlipidemia    Macular degeneration    Osteopenia    Osteoporosis    Pneumonia  childhood age 15    Varicose veins     Family History  Problem Relation Age of Onset   Osteoarthritis Mother    Rheum arthritis Mother    Cancer Father        liver   Heart disease Father    Hyperlipidemia Father    Hypertension Father    Heart attack Father    Other Father         varicose veins   Hyperlipidemia Sister    Macular degeneration Sister    Heart Problems Sister    Healthy Daughter    Healthy Son    Past Surgical History:  Procedure Laterality Date   CATARACT EXTRACTION Right    2011   FRACTURE SURGERY  2007   BIL wrist   PARTIAL HYSTERECTOMY     TOTAL KNEE ARTHROPLASTY Right 01/10/2018   Procedure: RIGHT TOTAL KNEE ARTHROPLASTY;  Surgeon: Melodi Lerner, MD;  Location: [facility];  Service: Orthopedics;  Laterality: Right;   WRIST FRACTURE SURGERY Bilateral 2004   Social History   Social History Narrative   Not on file   Immunization History  Administered Date(s) Administered   PFIZER(Purple Top)SARS-COV-2 Vaccination 05/20/2019, 06/13/2019, 02/07/2020     Objective: Vital Signs: BP 126/82 (BP Location: Left Arm, Patient Position: Sitting, Cuff Size: Normal)   Pulse 71   Resp 13   Ht 5' 3.5 (1.613 m)   Wt 155 lb 6.4 oz (70.5 kg)   BMI 27.10 kg/m    Physical Exam Vitals and nursing note reviewed.  Constitutional:      Appearance: She is well-developed.  HENT:     Head: Normocephalic and atraumatic.  Eyes:     Conjunctiva/sclera: Conjunctivae normal.  Cardiovascular:     Rate and Rhythm: Normal rate and regular rhythm.     Heart sounds: Normal heart sounds.  Pulmonary:     Effort: Pulmonary effort is normal.     Breath sounds: Normal breath sounds.  Abdominal:     General: Bowel sounds are normal.     Palpations: Abdomen is soft.  Musculoskeletal:     Cervical back: Normal range of motion.  Lymphadenopathy:     Cervical: No cervical adenopathy.  Skin:    General: Skin is warm and dry.     Capillary Refill: Capillary refill takes less than 2 seconds.  Neurological:     Mental Status: She is alert and oriented to person, place, and time.  Psychiatric:        Behavior: Behavior normal.      Musculoskeletal Exam: Cervical spine was in good range of motion.  Shoulders, elbows, wrist joints with good range of motion.  She  had MCP thickening and some synovitis over bilateral second MCP joints.  Some of the PIP joints had inflammation.  PIP and DIP thickening was noted bilaterally.  Hip joints and knee joints in good range of motion.  Right knee joint was replaced.  No warmth swelling or effusion was noted in the left knee joint.  She had bilateral hallux valgus deformity with overcrowding of the toes.  Bilateral pes planus was noted.  CDAI Exam: CDAI Score: -- Patient Global: --; Provider Global: -- Swollen: --; Tender: -- Joint Exam 12/21/2023   No joint exam has been documented for this visit   There is currently no information documented on the homunculus. Go to the Rheumatology activity and complete the homunculus joint exam.  Investigation: No additional findings.  Imaging: No results found.  Recent Labs:  Lab Results  Component Value Date   WBC 8.2 01/12/2018   HGB 11.0 (L) 01/12/2018   PLT 130 (L) 01/12/2018   NA 143 01/12/2018   K 4.2 01/12/2018   CL 108 01/12/2018   CO2 27 01/12/2018   GLUCOSE 112 (H) 01/12/2018   BUN 17 01/12/2018   CREATININE 0.70 01/12/2018   BILITOT 1.1 01/05/2018   ALKPHOS 44 01/05/2018   AST 26 01/05/2018   ALT 24 01/05/2018   PROT 6.9 01/05/2018   ALBUMIN 4.6 01/05/2018   CALCIUM 8.7 (L) 01/12/2018   GFRAA >60 01/12/2018    Speciality Comments: No specialty comments available.  Procedures:  No procedures performed Allergies: Rosanil cleanser [sulfacetamide sodium-sulfur], Sulfa antibiotics, and Sulfasalazine   Assessment / Plan:     Visit Diagnoses: Inflammatory arthritis - Seronegative inflammatory arthritis for many years.  She continues to have discomfort in her joints.  She denies any pain.  Synovitis is noted in some of the joints as described above.  I did detailed discussion with the patient regarding different treatment options including low-dose methotrexate as a trial.  Patient wants to hold off all immunosuppressive agents.  She will continue  with natural anti-inflammatories.  Primary osteoarthritis of both hands -she continues to have some synovitis in her MCP joints and PIP joints as described above.  We discussed repeating x-rays to look for radiographic progression.  She was in agreement.  I will obtain x-rays today.  Plan: XR Hand 2 View Right, XR Hand 2 View Left.  Radiographic progression with narrowing of MCP joints, PIP and DIP joints was noted.  No erosive changes were noted.  These findings suggestive of inflammatory arthritis and osteoarthritis overlap.  We notified patient about the x-ray results.  She wants to discuss future treatment plan at 6 months follow-up visit.  Primary osteoarthritis of left knee -she has been experiencing increased discomfort in her left knee joint.  Patient states she has been going for water  aerobics and has been having increased discomfort.  She started doing physical therapy.  Plan: XR KNEE 3 VIEW LEFT.  No radiographic progression was noted when compared to the x-rays of 2018.  Moderate medial compartment narrowing and severe chondromalacia patella was noted.  Patellar dislocation, unspecified laterality, subsequent encounter-followed by orthopedics.  Status post total knee replacement, right - January 09, 2018 by Dr. Hiram.  Primary osteoarthritis of both feet -she has severe osteoarthritis involving both feet with bilateral severe hallux valgus deformities and overcrowding of the toes.  She also has bilateral pes planus.  Dorsal spurs are noted.  She has new orthotics.  Age-related osteoporosis without current pathological fracture - DXA July 29, 2016 showed T score of -2.5 at [facility] physicians.  She has been taking calcium and vitamin D and does not want to take any medications for the treatment of osteoporosis at this point.  Orders: Orders Placed This Encounter  Procedures   XR Hand 2 View Right   XR Hand 2 View Left   XR KNEE 3 VIEW LEFT   No orders of the defined types were  placed in this encounter.    Follow-Up Instructions: Return in about 1 year (around 12/20/2024) for Osteoarthritis.   Maya Nash, MD  Note - This record has been created using Animal nutritionist.  Chart creation errors have been sought, but may not always  have been located. Such creation errors do not reflect on  the standard of medical care.

## 2023-12-08 DIAGNOSIS — H353114 Nonexudative age-related macular degeneration, right eye, advanced atrophic with subfoveal involvement: Secondary | ICD-10-CM | POA: Diagnosis not present

## 2023-12-08 DIAGNOSIS — H3322 Serous retinal detachment, left eye: Secondary | ICD-10-CM | POA: Diagnosis not present

## 2023-12-08 DIAGNOSIS — H353122 Nonexudative age-related macular degeneration, left eye, intermediate dry stage: Secondary | ICD-10-CM | POA: Diagnosis not present

## 2023-12-08 DIAGNOSIS — H353211 Exudative age-related macular degeneration, right eye, with active choroidal neovascularization: Secondary | ICD-10-CM | POA: Diagnosis not present

## 2023-12-08 DIAGNOSIS — H35721 Serous detachment of retinal pigment epithelium, right eye: Secondary | ICD-10-CM | POA: Diagnosis not present

## 2023-12-08 DIAGNOSIS — H353221 Exudative age-related macular degeneration, left eye, with active choroidal neovascularization: Secondary | ICD-10-CM | POA: Diagnosis not present

## 2023-12-17 DIAGNOSIS — M25562 Pain in left knee: Secondary | ICD-10-CM | POA: Diagnosis not present

## 2023-12-21 ENCOUNTER — Ambulatory Visit

## 2023-12-21 ENCOUNTER — Ambulatory Visit: Attending: Rheumatology | Admitting: Rheumatology

## 2023-12-21 ENCOUNTER — Encounter: Payer: Self-pay | Admitting: Rheumatology

## 2023-12-21 VITALS — BP 126/82 | HR 71 | Resp 13 | Ht 63.5 in | Wt 155.4 lb

## 2023-12-21 DIAGNOSIS — M1990 Unspecified osteoarthritis, unspecified site: Secondary | ICD-10-CM | POA: Diagnosis not present

## 2023-12-21 DIAGNOSIS — M19072 Primary osteoarthritis, left ankle and foot: Secondary | ICD-10-CM | POA: Diagnosis not present

## 2023-12-21 DIAGNOSIS — M19041 Primary osteoarthritis, right hand: Secondary | ICD-10-CM

## 2023-12-21 DIAGNOSIS — M19042 Primary osteoarthritis, left hand: Secondary | ICD-10-CM | POA: Diagnosis not present

## 2023-12-21 DIAGNOSIS — M19071 Primary osteoarthritis, right ankle and foot: Secondary | ICD-10-CM | POA: Diagnosis not present

## 2023-12-21 DIAGNOSIS — Z96651 Presence of right artificial knee joint: Secondary | ICD-10-CM | POA: Diagnosis not present

## 2023-12-21 DIAGNOSIS — M1712 Unilateral primary osteoarthritis, left knee: Secondary | ICD-10-CM

## 2023-12-21 DIAGNOSIS — M810 Age-related osteoporosis without current pathological fracture: Secondary | ICD-10-CM | POA: Diagnosis not present

## 2023-12-21 DIAGNOSIS — S83006D Unspecified dislocation of unspecified patella, subsequent encounter: Secondary | ICD-10-CM | POA: Diagnosis not present

## 2023-12-23 ENCOUNTER — Telehealth: Payer: Self-pay

## 2023-12-23 DIAGNOSIS — M25562 Pain in left knee: Secondary | ICD-10-CM | POA: Diagnosis not present

## 2023-12-23 NOTE — Telephone Encounter (Signed)
 Patient left a voicemail wanting to know the benefits of methotrexate. Dr. Dolphus did discuss methotrexate on 12/21/2023 and patient wanted to hold off but has since called and left a message wanting to discuss the benefits. Can you call patient to discuss? Thanks!

## 2023-12-23 NOTE — Telephone Encounter (Signed)
 Called patient back to discuss benefits of Methotrexate. Advised patient that osteoarthritis is normal wear and tear that is part of the aging process and methotrexate will benefit the inflammatory arthritis that Dr. Dolphus is now believing to be more related to rheumatoid arthritis. Patient stated she did not have rheumatoid arthritis but I told her that the x-ray results are looking more severe to Dr. Dolphus. Advised patient that methotrexate would not reverse any joint damage that has already occurred however, it could help in preventing further joint damage and erosive changes from occurring. Advised patient that she will not notice improvement in the first couple of weeks like prednisone. Patient states she is not on prednisone and does not take any drugs. Patient asked if methotrexate is still in clinical trials and I told her it is FDA approved. I advised her that this could help reduce joint pain and that is has helped many patients, but did not guarantee it will work for these individualized diseases. Advised patient it has worked for many patients and increased their quality of life. Patient stated she is not in pain so does not feel like the risks are worth it. Patient is worried about being on an immunosuppressant since she travels a lot and was worried about her kidney and liver. I told patient there are protocols in place she could follow for safety while traveling, and that we would monitor her renal and liver functions to ensure the drug is safe. Patient states it takes a lot for her to want to take a drug and she will hold off to see if disease worsens. I advised patient to call back if she changes her mind or has any questions.   Of note, if patient moves forward with medication she will need to sign consent forms and obtain all baseline immunosuppressive labs.   Quana Chamberlain C. Landon Bassford [facility] PharmD Candidate Class of [id number]

## 2023-12-28 DIAGNOSIS — M25562 Pain in left knee: Secondary | ICD-10-CM | POA: Diagnosis not present

## 2024-01-12 DIAGNOSIS — M25562 Pain in left knee: Secondary | ICD-10-CM | POA: Diagnosis not present

## 2024-01-19 DIAGNOSIS — H353211 Exudative age-related macular degeneration, right eye, with active choroidal neovascularization: Secondary | ICD-10-CM | POA: Diagnosis not present

## 2024-01-19 DIAGNOSIS — H3322 Serous retinal detachment, left eye: Secondary | ICD-10-CM | POA: Diagnosis not present

## 2024-01-19 DIAGNOSIS — H353113 Nonexudative age-related macular degeneration, right eye, advanced atrophic without subfoveal involvement: Secondary | ICD-10-CM | POA: Diagnosis not present

## 2024-01-19 DIAGNOSIS — H353122 Nonexudative age-related macular degeneration, left eye, intermediate dry stage: Secondary | ICD-10-CM | POA: Diagnosis not present

## 2024-01-19 DIAGNOSIS — H35721 Serous detachment of retinal pigment epithelium, right eye: Secondary | ICD-10-CM | POA: Diagnosis not present

## 2024-01-19 DIAGNOSIS — H353221 Exudative age-related macular degeneration, left eye, with active choroidal neovascularization: Secondary | ICD-10-CM | POA: Diagnosis not present

## 2024-01-26 DIAGNOSIS — M25562 Pain in left knee: Secondary | ICD-10-CM | POA: Diagnosis not present

## 2024-02-02 DIAGNOSIS — H353122 Nonexudative age-related macular degeneration, left eye, intermediate dry stage: Secondary | ICD-10-CM | POA: Diagnosis not present

## 2024-02-02 DIAGNOSIS — H35721 Serous detachment of retinal pigment epithelium, right eye: Secondary | ICD-10-CM | POA: Diagnosis not present

## 2024-02-02 DIAGNOSIS — H353211 Exudative age-related macular degeneration, right eye, with active choroidal neovascularization: Secondary | ICD-10-CM | POA: Diagnosis not present

## 2024-02-02 DIAGNOSIS — H353221 Exudative age-related macular degeneration, left eye, with active choroidal neovascularization: Secondary | ICD-10-CM | POA: Diagnosis not present

## 2024-02-02 DIAGNOSIS — H3322 Serous retinal detachment, left eye: Secondary | ICD-10-CM | POA: Diagnosis not present

## 2024-06-20 ENCOUNTER — Ambulatory Visit: Admitting: Rheumatology
